# Patient Record
Sex: Female | Born: 1985 | Race: Black or African American | Hispanic: No | Marital: Single | Smoking: Never smoker
Health system: Southern US, Community
[De-identification: ages and names within clinical notes are randomized; demographics above are authoritative.]

## PROBLEM LIST (undated history)

## (undated) DIAGNOSIS — K219 Gastro-esophageal reflux disease without esophagitis: Secondary | ICD-10-CM

## (undated) DIAGNOSIS — F419 Anxiety disorder, unspecified: Secondary | ICD-10-CM

## (undated) DIAGNOSIS — F99 Mental disorder, not otherwise specified: Secondary | ICD-10-CM

## (undated) HISTORY — DX: Mental disorder, not otherwise specified: F99

## (undated) HISTORY — DX: Gastro-esophageal reflux disease without esophagitis: K21.9

## (undated) HISTORY — PX: TONSILLECTOMY: SUR1361

## (undated) HISTORY — PX: WISDOM TOOTH EXTRACTION: SHX21

---
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
## 2007-04-13 ENCOUNTER — Inpatient Hospital Stay (HOSPITAL_COMMUNITY): Admission: AD | Admit: 2007-04-13 | Discharge: 2007-04-13 | Payer: Self-pay | Admitting: Obstetrics and Gynecology

## 2007-04-13 ENCOUNTER — Inpatient Hospital Stay (HOSPITAL_COMMUNITY): Admission: AD | Admit: 2007-04-13 | Discharge: 2007-04-16 | Payer: Self-pay | Admitting: Obstetrics and Gynecology

## 2008-02-15 ENCOUNTER — Emergency Department (HOSPITAL_COMMUNITY): Admission: AC | Admit: 2008-02-15 | Discharge: 2008-02-16 | Payer: Self-pay
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
## 2010-12-28 NOTE — Op Note (Signed)
NAMENATHIFA, RITTHALER               ACCOUNT NO.:  [id number]   MEDICAL RECORD NO.:  [id number]          PATIENT TYPE:  AMB   LOCATION:  DSC                          FACILITY:  [facility]   PHYSICIAN:  Christopher E. 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   OPERATION:  Tonsillectomy and adenoidectomy.   SURGEON:  Kristine Garbe. Ezzard Standing, M.D.   ANESTHESIA:  General endotracheal.   COMPLICATIONS:  None.   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  On examination ,she has  large 2 to 3+ sized tonsils as well as large, obstructing adenoid tissue.  She is taken to the operating room at this time for a tonsillectomy and  adenoidectomy.   DESCRIPTION OF PROCEDURE:  After adequate endotracheal anesthesia, the  patient received 10 mg of Decadron IV preoperatively as well as 1 g of Ancef  IV preoperatively.  A mouth gag was used to expose the oropharynx.  The left  and right tonsils were resected from the tonsillar fossa using the cautery.  Hemostasis was obtained with the cautery.  This completed the tonsillectomy.  A red rubber catheter was passed through the nose and out the mouth to  retract the soft palate. The nasopharynx was examined.  Mary [name] had large  obstructing adenoid tissue back in the nasal choana.  A curved curet was  used to remove a central pad of adenoid tissue.  A nasopharyngeal pack was  placed for hemostasis.  Then, further adenoid tissue was removed with  suction cautery.  After removing adequate adenoid tissue, the nose and  nasopharynx were irrigated with saline.  This completed the procedure.  Mary [name] was  awoken from anesthesia, was transferred to the recovery room, and  postoperatively did well.   DISPOSITION:  Mary [name] is discharged home later this morning on amoxicillin  suspension 400 mg b.i.d. for one week, Tylenol and Lortab elixir 1 to 1-1/2  tablespoons q.4h. p.r.n. pain.  Will have her follow up in my office in 10-  12 days for recheck.      CEN/MEDQ  D:  10/05/2004  T:  10/05/2004  Job:  [id number]

## 2011-05-09 LAB — POCT I-STAT, CHEM 8
BUN: 7
Calcium, Ion: 1.09 — ABNORMAL LOW
Creatinine, Ser: 0.9
Hemoglobin: 13.9
Sodium: 144
TCO2: 20

## 2011-05-09 LAB — CBC
HCT: 40.3
Hemoglobin: 13.5
MCHC: 33.5
MCV: 89.8
Platelets: 243
RBC: 4.48
RDW: 12.4
WBC: 8.1

## 2011-05-09 LAB — SAMPLE TO BLOOD BANK

## 2011-05-09 LAB — PROTIME-INR
INR: 0.9
Prothrombin Time: 12.7

## 2011-05-24 LAB — CBC
HCT: 35.7 — ABNORMAL LOW
Hemoglobin: 12.2
MCHC: 34.5
MCV: 90.2
MCV: 91.5
Platelets: 165
Platelets: 196
RBC: 3.9
RBC: 4.35
WBC: 11.4 — ABNORMAL HIGH
WBC: 13.3 — ABNORMAL HIGH

## 2011-05-24 LAB — COMPREHENSIVE METABOLIC PANEL
ALT: 16
AST: 22
Albumin: 3.3 — ABNORMAL LOW
Calcium: 9
Creatinine, Ser: 0.57
GFR calc Af Amer: >60
Sodium: 136
Total Protein: 6.7

## 2011-05-24 LAB — URIC ACID: Uric Acid, Serum: 4.5

## 2016-06-11 LAB — OB RESULTS CONSOLE RUBELLA ANTIBODY, IGM: Rubella: IMMUNE

## 2016-06-11 LAB — OB RESULTS CONSOLE ABO/RH: RH TYPE: POSITIVE

## 2016-06-11 LAB — OB RESULTS CONSOLE GC/CHLAMYDIA
CHLAMYDIA, DNA PROBE: NEGATIVE
Gonorrhea: NEGATIVE

## 2016-06-11 LAB — OB RESULTS CONSOLE HEPATITIS B SURFACE ANTIGEN: Hepatitis B Surface Ag: NEGATIVE

## 2016-06-11 LAB — OB RESULTS CONSOLE HIV ANTIBODY (ROUTINE TESTING): HIV: NONREACTIVE

## 2016-06-11 LAB — OB RESULTS CONSOLE RPR: RPR: NONREACTIVE

## 2016-06-11 LAB — OB RESULTS CONSOLE ANTIBODY SCREEN: ANTIBODY SCREEN: NEGATIVE

## 2016-08-12 NOTE — L&D Delivery Note (Signed)
31 y.o. [id number] at 4565w0d delivered a viable female infant in cephalic, ROT position. No nuchal cord.  30 second shoulder dystocia resolved with McRoberts maneuver and wood screw. With wood screw, able to deliver the anterior shoulder. 60 sec delayed cord clamping. Cord clamped x2 and cut. Placenta delivered spontaneously intact, with 3VC. Fundus firm on exam with massage and pitocin. Good hemostasis noted.  Anesthesia: Epidural Laceration: Left labial, hemostatic  Suture: n/a  Good hemostasis noted. EBL: 100 cc  Mom and baby recovering in LDR.    Apgars: APGAR (1 MIN):  8 APGAR (5 MINS):  9 APGAR (10 MINS):   Weight: Pending skin to skin  Sponge and instrument count were correct x2. Placenta sent to L&D.   Al CorpusMatthew R Grigor Lipschutz, MD [facility] 01/08/2017, 6:03 PM

## 2016-11-20 ENCOUNTER — Ambulatory Visit (INDEPENDENT_AMBULATORY_CARE_PROVIDER_SITE_OTHER): Payer: Medicaid Other | Admitting: Obstetrics & Gynecology

## 2016-11-20 ENCOUNTER — Encounter: Payer: Self-pay | Admitting: Obstetrics & Gynecology

## 2016-11-20 ENCOUNTER — Encounter: Payer: Self-pay | Admitting: *Deleted

## 2016-11-20 VITALS — BP 105/65 | HR 95 | Ht 65.0 in | Wt 206.5 lb

## 2016-11-20 DIAGNOSIS — O99810 Abnormal glucose complicating pregnancy: Secondary | ICD-10-CM

## 2016-11-20 DIAGNOSIS — Z3483 Encounter for supervision of other normal pregnancy, third trimester: Secondary | ICD-10-CM | POA: Diagnosis not present

## 2016-11-20 DIAGNOSIS — Z3493 Encounter for supervision of normal pregnancy, unspecified, third trimester: Secondary | ICD-10-CM | POA: Insufficient documentation

## 2016-11-20 NOTE — Patient Instructions (Addendum)
Return to clinic for any scheduled appointments or obstetric concerns, or go to [facility] for evaluation   Third Trimester of Pregnancy The third trimester is from week 28 through week 40 (months 7 through 9). The third trimester is a time when the unborn baby (fetus) is growing rapidly. At the end of the ninth month, the fetus is about 20 inches in length and weighs 6-10 pounds. Body changes during your third trimester Your body will continue to go through many changes during pregnancy. The changes vary from woman to woman. During the third trimester:  Your weight will continue to increase. You can expect to gain 25-35 pounds (11-16 kg) by the end of the pregnancy.  You may begin to get stretch marks on your hips, abdomen, and breasts.  You may urinate more often because the fetus is moving lower into your pelvis and pressing on your bladder.  You may develop or continue to have heartburn. This is caused by increased hormones that slow down muscles in the digestive tract.  You may develop or continue to have constipation because increased hormones slow digestion and cause the muscles that push waste through your intestines to relax.  You may develop hemorrhoids. These are swollen veins (varicose veins) in the rectum that can itch or be painful.  You may develop swollen, bulging veins (varicose veins) in your legs.  You may have increased body aches in the pelvis, back, or thighs. This is due to weight gain and increased hormones that are relaxing your joints.  You may have changes in your hair. These can include thickening of your hair, rapid growth, and changes in texture. Some women also have hair loss during or after pregnancy, or hair that feels dry or thin. Your hair will most likely return to normal after your baby is born.  Your breasts will continue to grow and they will continue to become tender. A yellow fluid (colostrum) may leak from your breasts. This is the first milk you are  producing for your baby.  Your belly button may stick out.  You may notice more swelling in your hands, face, or ankles.  You may have increased tingling or numbness in your hands, arms, and legs. The skin on your belly may also feel numb.  You may feel short of breath because of your expanding uterus.  You may have more problems sleeping. This can be caused by the size of your belly, increased need to urinate, and an increase in your body's metabolism.  You may notice the fetus "dropping," or moving lower in your abdomen (lightening).  You may have increased vaginal discharge.  You may notice your joints feel loose and you may have pain around your pelvic bone. What to expect at prenatal visits You will have prenatal exams every 2 weeks until week 36. Then you will have weekly prenatal exams. During a routine prenatal visit:  You will be weighed to make sure you and the baby are growing normally.  Your blood pressure will be taken.  Your abdomen will be measured to track your baby's growth.  The fetal heartbeat will be listened to.  Any test results from the previous visit will be discussed.  You may have a cervical check near your due date to see if your cervix has softened or thinned (effaced).  You will be tested for Group B streptococcus. This happens between 35 and 37 weeks. Your health care provider may ask you:  What your birth plan is.  How  you are feeling.  If you are feeling the baby move.  If you have had any abnormal symptoms, such as leaking fluid, bleeding, severe headaches, or abdominal cramping.  If you are using any tobacco products, including cigarettes, chewing tobacco, and electronic cigarettes.  If you have any questions. Other tests or screenings that may be performed during your third trimester include:  Blood tests that check for low iron levels (anemia).  Fetal testing to check the health, activity level, and growth of the fetus. Testing is  done if you have certain medical conditions or if there are problems during the pregnancy.  Nonstress test (NST). This test checks the health of your baby to make sure there are no signs of problems, such as the baby not getting enough oxygen. During this test, a belt is placed around your belly. The baby is made to move, and its heart rate is monitored during movement. What is false labor? False labor is a condition in which you feel small, irregular tightenings of the muscles in the womb (contractions) that usually go away with rest, changing position, or drinking water. These are called Braxton Hicks contractions. Contractions may last for hours, days, or even weeks before true labor sets in. If contractions come at regular intervals, become more frequent, increase in intensity, or become painful, you should see your health care provider. What are the signs of labor?  Abdominal cramps.  Regular contractions that start at 10 minutes apart and become stronger and more frequent with time.  Contractions that start on the top of the uterus and spread down to the lower abdomen and back.  Increased pelvic pressure and dull back pain.  A watery or bloody mucus discharge that comes from the vagina.  Leaking of amniotic fluid. This is also known as your "water breaking." It could be a slow trickle or a gush. Let your health care provider know if it has a color or strange odor. If you have any of these signs, call your health care provider right away, even if it is before your due date. Follow these instructions at home: Medicines   Follow your health care provider's instructions regarding medicine use. Specific medicines may be either safe or unsafe to take during pregnancy.  Take a prenatal vitamin that contains at least 600 micrograms (mcg) of folic acid.  If you develop constipation, try taking a stool softener if your health care provider approves. Eating and drinking   Eat a balanced diet  that includes fresh fruits and vegetables, whole grains, good sources of protein such as meat, eggs, or tofu, and low-fat dairy. Your health care provider will help you determine the amount of weight gain that is right for you.  Avoid raw meat and uncooked cheese. These carry germs that can cause birth defects in the baby.  If you have low calcium intake from food, talk to your health care provider about whether you should take a daily calcium supplement.  Eat four or five small meals rather than three large meals a day.  Limit foods that are high in fat and processed sugars, such as fried and sweet foods.  To prevent constipation:  Drink enough fluid to keep your urine clear or pale yellow.  Eat foods that are high in fiber, such as fresh fruits and vegetables, whole grains, and beans. Activity   Exercise only as directed by your health care provider. Most women can continue their usual exercise routine during pregnancy. Try to exercise for 30  minutes at least 5 days a week. Stop exercising if you experience uterine contractions.  Avoid heavy lifting.  Do not exercise in extreme heat or humidity, or at high altitudes.  Wear low-heel, comfortable shoes.  Practice good posture.  You may continue to have sex unless your health care provider tells you otherwise. Relieving pain and discomfort   Take frequent breaks and rest with your legs elevated if you have leg cramps or low back pain.  Take warm sitz baths to soothe any pain or discomfort caused by hemorrhoids. Use hemorrhoid cream if your health care provider approves.  Wear a good support bra to prevent discomfort from breast tenderness.  If you develop varicose veins:  Wear support pantyhose or compression stockings as told by your healthcare provider.  Elevate your feet for 15 minutes, 3-4 times a day. Prenatal care   Write down your questions. Take them to your prenatal visits.  Keep all your prenatal visits as told  by your health care provider. This is important. Safety   Wear your seat belt at all times when driving.  Make a list of emergency phone numbers, including numbers for family, friends, the hospital, and police and fire departments. General instructions   Avoid cat litter boxes and soil used by cats. These carry germs that can cause birth defects in the baby. If you have a cat, ask someone to clean the litter box for you.  Do not travel far distances unless it is absolutely necessary and only with the approval of your health care provider.  Do not use hot tubs, steam rooms, or saunas.  Do not drink alcohol.  Do not use any products that contain nicotine or tobacco, such as cigarettes and e-cigarettes. If you need help quitting, ask your health care provider.  Do not use any medicinal herbs or unprescribed drugs. These chemicals affect the formation and growth of the baby.  Do not douche or use tampons or scented sanitary pads.  Do not cross your legs for long periods of time.  To prepare for the arrival of your baby:  Take prenatal classes to understand, practice, and ask questions about labor and delivery.  Make a trial run to the hospital.  Visit the hospital and tour the maternity area.  Arrange for maternity or paternity leave through employers.  Arrange for family and friends to take care of pets while you are in the hospital.  Purchase a rear-facing car seat and make sure you know how to install it in your car.  Pack your hospital bag.  Prepare the baby's nursery. Make sure to remove all pillows and stuffed animals from the baby's crib to prevent suffocation.  Visit your dentist if you have not gone during your pregnancy. Use a soft toothbrush to brush your teeth and be gentle when you floss. Contact a health care provider if:  You are unsure if you are in labor or if your water has broken.  You become dizzy.  You have mild pelvic cramps, pelvic pressure, or  nagging pain in your abdominal area.  You have lower back pain.  You have persistent nausea, vomiting, or diarrhea.  You have an unusual or bad smelling vaginal discharge.  You have pain when you urinate. Get help right away if:  Your water breaks before 37 weeks.  You have regular contractions less than 5 minutes apart before 37 weeks.  You have a fever.  You are leaking fluid from your vagina.  You have spotting or  bleeding from your vagina.  You have severe abdominal pain or cramping.  You have rapid weight loss or weight gain.  You have shortness of breath with chest pain.  You notice sudden or extreme swelling of your face, hands, ankles, feet, or legs.  Your baby makes fewer than 10 movements in 2 hours.  You have severe headaches that do not go away when you take medicine.  You have vision changes. Summary  The third trimester is from week 28 through week 40, months 7 through 9. The third trimester is a time when the unborn baby (fetus) is growing rapidly.  During the third trimester, your discomfort may increase as you and your baby continue to gain weight. You may have abdominal, leg, and back pain, sleeping problems, and an increased need to urinate.  During the third trimester your breasts will keep growing and they will continue to become tender. A yellow fluid (colostrum) may leak from your breasts. This is the first milk you are producing for your baby.  False labor is a condition in which you feel small, irregular tightenings of the muscles in the womb (contractions) that eventually go away. These are called Braxton Hicks contractions. Contractions may last for hours, days, or even weeks before true labor sets in.  Signs of labor can include: abdominal cramps; regular contractions that start at 10 minutes apart and become stronger and more frequent with time; watery or bloody mucus discharge that comes from the vagina; increased pelvic pressure and dull back  pain; and leaking of amniotic fluid. This information is not intended to replace advice given to you by your health care provider. Make sure you discuss any questions you have with your health care provider. Document Released: 07/23/2001 Document Revised: 01/04/2016 Document Reviewed: 09/29/2012 Elsevier Interactive Patient Education  2017 [employer].    Tdap Vaccine (Tetanus, Diphtheria and Pertussis): What You Need to Know 1. Why get vaccinated? Tetanus, diphtheria and pertussis are very serious diseases. Tdap vaccine can protect [place] from these diseases. And, Tdap vaccine given to pregnant women can protect newborn babies against pertussis. TETANUS (Lockjaw) is rare in the [place] today. It causes painful muscle tightening and stiffness, usually all over the body.  It can lead to tightening of muscles in the head and neck so you can't open your mouth, swallow, or sometimes even breathe. Tetanus kills about 1 out of 10 people who are infected even after receiving the best medical care. DIPHTHERIA is also rare in the [place] today. It can cause a thick coating to form in the back of the throat.  It can lead to breathing problems, heart failure, paralysis, and death. PERTUSSIS (Whooping Cough) causes severe coughing spells, which can cause difficulty breathing, vomiting and disturbed sleep.  It can also lead to weight loss, incontinence, and rib fractures. Up to 2 in 100 adolescents and 5 in 100 adults with pertussis are hospitalized or have complications, which could include pneumonia or death. These diseases are caused by bacteria. Diphtheria and pertussis are spread from person to person through secretions from coughing or sneezing. Tetanus enters the body through cuts, scratches, or wounds. Before vaccines, as many as 200,000 cases of diphtheria, 200,000 cases of pertussis, and hundreds of cases of tetanus, were reported in the [place] each year. Since vaccination began,  reports of cases for tetanus and diphtheria have dropped by about 99% and for pertussis by about 80%. 2. Tdap vaccine Tdap vaccine can protect adolescents and adults from  and pertussis. One dose of Tdap is routinely given at age 11 or 12. People who did not get Tdap at that age should get it as soon as possible. Tdap is especially important for healthcare professionals and anyone having close contact with a baby younger than 12 months. Pregnant women should get a dose of Tdap during every pregnancy, to protect the newborn from pertussis. Infants are most at risk for severe, life-threatening complications from pertussis. Another vaccine, called Td, protects against tetanus and diphtheria, but not pertussis. A Td booster should be given every 10 years. Tdap may be given as one of these boosters if you have never gotten Tdap before. Tdap may also be given after a severe cut or burn to prevent tetanus infection. Your doctor or the person giving you the vaccine can give you more information. Tdap may safely be given at the same time as other vaccines. 3. Some people should not get this vaccine  A person who has ever had a life-threatening allergic reaction after a previous dose of any diphtheria, tetanus or pertussis containing vaccine, OR has a severe allergy to any part of this vaccine, should not get Tdap vaccine. Tell the person giving the vaccine about any severe allergies.  Anyone who had coma or long repeated seizures within 7 days after a childhood dose of DTP or DTaP, or a previous dose of Tdap, should not get Tdap, unless a cause other than the vaccine was found. They can still get Td.  Talk to your doctor if you:  have seizures or another nervous system problem,  had severe pain or swelling after any vaccine containing diphtheria, tetanus or pertussis,  ever had a condition called Guillain-Barr Syndrome (GBS),  aren't feeling well on the day the shot is  scheduled. 4. Risks With any medicine, including vaccines, there is a chance of side effects. These are usually mild and go away on their own. Serious reactions are also possible but are rare. Most people who get Tdap vaccine do not have any problems with it. Mild problems following Tdap:  (Did not interfere with activities)  Pain where the shot was given (about 3 in 4 adolescents or 2 in 3 adults)  Redness or swelling where the shot was given (about 1 person in 5)  Mild fever of at least 100.4F (up to about 1 in 25 adolescents or 1 in 100 adults)  Headache (about 3 or 4 people in 10)  Tiredness (about 1 person in 3 or 4)  Nausea, vomiting, diarrhea, stomach ache (up to 1 in 4 adolescents or 1 in 10 adults)  Chills, sore joints (about 1 person in 10)  Body aches (about 1 person in 3 or 4)  Rash, swollen glands (uncommon) Moderate problems following Tdap:  (Interfered with activities, but did not require medical attention)  Pain where the shot was given (up to 1 in 5 or 6)  Redness or swelling where the shot was given (up to about 1 in 16 adolescents or 1 in 12 adults)  Fever over 102F (about 1 in 100 adolescents or 1 in 250 adults)  Headache (about 1 in 7 adolescents or 1 in 10 adults)  Nausea, vomiting, diarrhea, stomach ache (up to 1 or 3 people in 100)  Swelling of the entire arm where the shot was given (up to about 1 in 500). Severe problems following Tdap:  (Unable to perform usual activities; required medical attention)  Swelling, severe pain, bleeding and redness in the arm where   arm where the shot was given (rare). Problems that could happen after any vaccine:   People sometimes faint after a medical procedure, including vaccination. Sitting or lying down for about 15 minutes can help prevent fainting, and injuries caused by a fall. Tell your doctor if you feel dizzy, or have vision changes or ringing in the ears.  Some people get severe pain in the shoulder and have  difficulty moving the arm where a shot was given. This happens very rarely.  Any medication can cause a severe allergic reaction. Such reactions from a vaccine are very rare, estimated at fewer than 1 in a million doses, and would happen within a few minutes to a few hours after the vaccination. As with any medicine, there is a very remote chance of a vaccine causing a serious injury or death. The safety of vaccines is always being monitored. For more information, visit: http://floyd.org/ 5. What if there is a serious problem? What should I look for?  Look for anything that concerns you, such as signs of a severe allergic reaction, very high fever, or unusual behavior. Signs of a severe allergic reaction can include hives, swelling of the face and throat, difficulty breathing, a fast heartbeat, dizziness, and weakness. These would usually start a few minutes to a few hours after the vaccination. What should I do?   If you think it is a severe allergic reaction or other emergency that can't wait, call 9-1-1 or get the person to the nearest hospital. Otherwise, call your doctor.  Afterward, the reaction should be reported to the Vaccine Adverse Event Reporting System (VAERS). Your doctor might file this report, or you can do it yourself through the VAERS web site at www.vaers.LAgents.no, or by calling 1-534-370-0784.  VAERS does not give medical advice. 6. The National Vaccine Injury Compensation Program The [employer] Vaccine Injury Compensation Program (VICP) is a federal program that was created to compensate people who may have been injured by certain vaccines. Persons who believe they may have been injured by a vaccine can learn about the program and about filing a claim by calling 1-402-698-3475 or visiting the VICP website at SpiritualWord.at. There is a time limit to file a claim for compensation. 7. How can I learn more?  Ask your doctor. He or she can give you the  vaccine package insert or suggest other sources of information.  Call your local or state health department.  Contact the Centers for Disease Control and Prevention (CDC):  Call 419-469-9278 (1-800-CDC-INFO) or  Visit CDC's website at PicCapture.uy CDC Tdap Vaccine VIS (10/05/13) This information is not intended to replace advice given to you by your health care provider. Make sure you discuss any questions you have with your health care provider. Document Released: 01/28/2012 Document Revised: 04/18/2016 Document Reviewed: 04/18/2016 Elsevier Interactive Patient Education  2017 [employer].   AREA PEDIATRIC/FAMILY PRACTICE PHYSICIANS  [facility] [address] [place], [place]  [address] Phone - [phone]   Fax - [phone]  [facility] [address] [place], [place] [address] Phone - [phone]   Fax - [phone]  JACK AMOS [address] [place], [place]  [address] Phone - [phone]   Fax - [phone]  [facility] [address] [place], [place]  [address] Phone - [phone]   Fax - [phone]  [facility] [address] [place], [place]  [address] Phone - [phone]   Fax -  [phone]  [facility] [address] [place], [place]  [address] Phone - [phone]   Fax - [phone]  [facility] [place] [address] [place], [place]  [address] Phone - [phone]   Fax - [phone]  [facility] [address], [address] [place], [place]  [address] Phone - [phone]   Fax - [phone]  [facility] AT [facility] [address] [place], [place]  [address] Phone - [phone]   Fax - [phone] [facility] [place] [address] [place], [place]  [address] Phone - [phone]   Fax - [phone]  [facility] [address] [place], [place]  [address] Phone - [phone]   Fax - [phone]  [facility] [address] [place], [place]  [address] Phone - [phone]   Fax - [phone]  [facility] [place] [address] [place], [place]  [address] Phone - [phone]   Fax - [phone]  [facility] [address] [place], [place]  [address] Phone - [phone]  [facility] [address] [place], [place]  [address] Phone - [phone]   Fax - [phone]  [facility] [address] [place], [place]  [address] Phone - [phone]   Fax - [phone]  [facility] [address] [place], [place]  [address] Phone - [phone]   Fax - [phone]  [facility] [address] [place], [place]  [address] Phone - [phone]   Fax - [phone]   [facility] [address] [place], [place]  [address] Phone - [phone]   Fax - [phone]  [facility] [address] [place], [place]  [address] Phone - [phone]   Fax - [phone]  DAVID RUBIN [address] [place], [place]  [address] Phone - [phone]   Fax - [phone]  [facility] [address] [place], [place]  [address] Phone - [phone]   Fax - [phone]  [place] [facility] Alita Chyle [address] [place], [place]  [address] Phone - [phone]   Fax - [phone] Gerarda Fraction [address] [place], [place]  [address] Phone - [phone]   Fax - [phone]  [facility] [address] [place], [place]  [address] Phone - [phone]   Fax - [phone]  [facility] - [place] [address] [place], [place]  [address] Phone - [phone]   Fax - [phone]  [place]  [facility] [place] Wyvonne Lenz MD [address] [place] [address] Phone [phone]  Fax [phone]

## 2016-11-20 NOTE — Progress Notes (Signed)
   PRENATAL VISIT NOTE  Subjective:  Mary [name] is a 31 y.o. G3P2002 at [redacted]w[redacted]d being seen today for transfer of prenatal care from [place], [place].  No issues during pregnancy except for recently elevated 1 hr GTT 175; was not able to do diagnostic test.  She is currently monitored for the following issues for this low-risk pregnancy and has Abnormal glucose tolerance test (GTT) during pregnancy, antepartum and Supervision of normal pregnancy in third trimester on her problem list.  Patient reports no complaints.  Contractions: Not present.  .  Movement: Present. Denies leaking of fluid.   The following portions of the patient's history were reviewed and updated as appropriate: allergies, current medications, past family history, past medical history, past social history, past surgical history and problem list. Problem list updated.  Objective:   Vitals:   11/20/16 1511 11/20/16 1512  BP: 105/65   Pulse: 95   Weight: 206 lb 8 oz (93.7 kg)   Height:   (1.651 m)    Fetal Status: Fetal Heart Rate (bpm): 150 Fundal Height: 33 cm Movement: Present     General:  Alert, oriented and cooperative. Patient is in no acute distress.  Skin: Skin is warm and dry. No rash noted.   Cardiovascular: Normal heart rate noted  Respiratory: Normal respiratory effort, no problems with respiration noted  Abdomen: Soft, gravid, appropriate for gestational age. Pain/Pressure: Present     Pelvic:  Cervical exam deferred        Extremities: Normal range of motion.  Edema: None  Mental Status: Normal mood and affect. Normal behavior. Normal judgment and thought content.   Assessment and Plan:  Pregnancy: G3P2002 at [redacted]w[redacted]d  1. Abnormal glucose tolerance test (GTT) during pregnancy, antepartum Will return for 2 hr GTT tomorrow.  Will follow up results.   2. Encounter for supervision of other normal pregnancy in third trimester Preterm labor symptoms and general obstetric precautions including but not limited  to vaginal bleeding, contractions, leaking of fluid and fetal movement were reviewed in detail with the patient. Please refer to After Visit Summary for other counseling recommendations.  Return in about 1 day (around 11/21/2016) for 2hr GTT.      2 weeks: OB Visit (HOB), TDap.   Tereso Newcomer, MD\

## 2016-11-21 ENCOUNTER — Other Ambulatory Visit: Payer: Medicaid Other

## 2016-11-21 DIAGNOSIS — O99810 Abnormal glucose complicating pregnancy: Secondary | ICD-10-CM

## 2016-11-22 LAB — GLUCOSE TOLERANCE, 2 HOURS W/ 1HR
GLUCOSE, 1 HOUR: 161 mg/dL (ref 65–179)
GLUCOSE, 2 HOUR: 108 mg/dL (ref 65–152)
Glucose, Fasting: 82 mg/dL (ref 65–91)

## 2016-12-02 ENCOUNTER — Ambulatory Visit (INDEPENDENT_AMBULATORY_CARE_PROVIDER_SITE_OTHER): Payer: Medicaid Other | Admitting: Family Medicine

## 2016-12-02 VITALS — BP 113/65 | HR 84 | Wt 207.0 lb

## 2016-12-02 DIAGNOSIS — Z3483 Encounter for supervision of other normal pregnancy, third trimester: Secondary | ICD-10-CM

## 2016-12-02 NOTE — Patient Instructions (Signed)
Gestational Diabetes Mellitus, Diagnosis Gestational diabetes (gestational diabetes mellitus) is a short-term (temporary) form of diabetes that can happen during pregnancy. It goes away after you give birth. It may be caused by one or both of these problems:  Your body does not make enough of a hormone called insulin.  Your body does not respond in a normal way to insulin that it makes. Insulin lets sugars (glucose) go into cells in the body. This gives you energy. If you have diabetes, sugars cannot get into cells. This causes high blood sugar (hyperglycemia). If diabetes is treated, it may not hurt you or your baby. Your doctor will set treatment goals for you. In general, you should have these blood sugar levels:  After not eating for a long time (fasting): 95 mg/dL (5.3 mmol/L).  After meals (postprandial):  One hour after a meal: at or below 140 mg/dL (7.8 mmol/L).  Two hours after a meal: at or below 120 mg/dL (6.7 mmol/L).  A1c (hemoglobin A1c) level: 6-6.5%. Follow these instructions at home: Questions to Ask Your Doctor  You may want to ask these questions:  Do I need to meet with a diabetes educator?  Where can I find a support group for people with diabetes?  What equipment will I need to care for myself at home?  What diabetes medicines do I need? When should I take them?  How often do I need to check my blood sugar?  What number can I call if I have questions?  When is my next doctor's visit? General instructions  Take over-the-counter and prescription medicines only as told by your doctor.  Stay at a healthy weight during pregnancy.  Keep all follow-up visits as told by your doctor. This is important. Contact a doctor if:  Your blood sugar is at or above 240 mg/dL (13.3 mmol/L).  Your blood sugar is at or above 200 mg/dL (11.1 mmol/L) and you have ketones in your pee (urine).  You have been sick or have had a fever for 2 days or more and you are not  getting better.  You have any of these problems for more than 6 hours:  You cannot eat or drink.  You feel sick to your stomach (nauseous).  You throw up (vomit).  You have watery poop (diarrhea). Get help right away if:  Your blood sugar is lower than 54 mg/dL (3 mmol/L).  You get confused.  You have trouble:  Thinking clearly.  Breathing.  Your baby moves less than normal.  You have:  Moderate or large ketone levels in your pee (urine).  Bleeding from your vagina.  Unusual fluid coming from your vagina.  Early contractions. These may feel like tightness in your belly. This information is not intended to replace advice given to you by your health care provider. Make sure you discuss any questions you have with your health care provider. Document Released: 11/20/2015 Document Revised: 01/04/2016 Document Reviewed: 09/01/2015 Elsevier Interactive Patient Education  2017 Elsevier Inc.  

## 2016-12-02 NOTE — Progress Notes (Signed)
      Subjective:  Mary [name] is a 31 y.o. G3P2002 at [redacted]w[redacted]d being seen today for ongoing prenatal care.  She is currently monitored for the following issues for this low-risk pregnancy and has Abnormal glucose tolerance test (GTT) during pregnancy, antepartum and Supervision of normal pregnancy in third trimester on her problem list.  Patient reports no complaints.  Contractions: Not present. Vag. Bleeding: None.  Movement: Present. Denies leaking of fluid.   The following portions of the patient's history were reviewed and updated as appropriate: allergies, current medications, past family history, past medical history, past social history, past surgical history and problem list. Problem list updated.  Objective:   Vitals:   12/02/16 1530  BP: 113/65  Pulse: 84  Weight: 207 lb (93.9 kg)    Fetal Status: Fetal Heart Rate (bpm): 140 Fundal Height: 34 cm Movement: Present      General:  Alert, oriented and cooperative. Patient is in no acute distress.  Skin: Skin is warm and dry. No rash noted.   Cardiovascular: Normal heart rate noted  Respiratory: Normal respiratory effort, no problems with respiration noted  Abdomen: Soft, gravid, appropriate for gestational age. Pain/Pressure: Present     Pelvic:  Cervical exam deferred        Extremities: Normal range of motion.  Edema: None  Mental Status: Normal mood and affect. Normal behavior. Normal judgment and thought content.   Urinalysis:      Assessment and Plan:  Pregnancy: G3P2002 at [redacted]w[redacted]d  1. Encounter for supervision of other normal pregnancy in third trimester - 2 hr GTT was normal - UTD on labs - Return in 2 weeks for routine OB visit   MOC: IUD MOF: Bottle Preterm labor symptoms and general obstetric precautions including but not limited to vaginal bleeding, contractions, leaking of fluid and fetal movement were reviewed in detail with the patient. Please refer to After Visit Summary for other counseling  recommendations.  Return in about 2 weeks (around 12/16/2016) for Routine OB visit.   Jen Mow, DO OB Fellow [facility], [facility]

## 2016-12-16 ENCOUNTER — Other Ambulatory Visit (HOSPITAL_COMMUNITY)
Admission: RE | Admit: 2016-12-16 | Discharge: 2016-12-16 | Disposition: A | Discharge status: Home / Self Care | Payer: Medicaid Other | Source: Ambulatory Visit | Attending: Obstetrics and Gynecology | Admitting: Obstetrics and Gynecology

## 2016-12-16 ENCOUNTER — Ambulatory Visit (INDEPENDENT_AMBULATORY_CARE_PROVIDER_SITE_OTHER): Payer: Medicaid Other | Admitting: Obstetrics and Gynecology

## 2016-12-16 VITALS — BP 121/66 | HR 102 | Wt 210.1 lb

## 2016-12-16 DIAGNOSIS — Z3483 Encounter for supervision of other normal pregnancy, third trimester: Secondary | ICD-10-CM | POA: Diagnosis not present

## 2016-12-16 DIAGNOSIS — Z113 Encounter for screening for infections with a predominantly sexual mode of transmission: Secondary | ICD-10-CM | POA: Diagnosis not present

## 2016-12-16 DIAGNOSIS — Z3493 Encounter for supervision of normal pregnancy, unspecified, third trimester: Secondary | ICD-10-CM | POA: Insufficient documentation

## 2016-12-16 NOTE — Progress Notes (Signed)
Prenatal Visit Note Date: 12/16/2016 Clinic: [facility]  Subjective:  Mary [name] is a 31 y.o. G3P2002 at 7130w5d being seen today for ongoing prenatal care.  She is currently monitored for the following issues for this low-risk pregnancy and has Abnormal glucose tolerance test (GTT) during pregnancy, antepartum and Supervision of normal pregnancy in third trimester on her problem list.  Patient reports no complaints.   Contractions: Irritability. Vag. Bleeding: None.  Movement: Present. Denies leaking of fluid.   The following portions of the patient's history were reviewed and updated as appropriate: allergies, current medications, past family history, past medical history, past social history, past surgical history and problem list. Problem list updated.  Objective:   Vitals:   12/16/16 1522  BP: 121/66  Pulse: (!) 102  Weight: 210 lb 1.6 oz (95.3 kg)    Fetal Status: Fetal Heart Rate (bpm): 153 Fundal Height: 36 cm Movement: Present  Presentation: Vertex  General:  Alert, oriented and cooperative. Patient is in no acute distress.  Skin: Skin is warm and dry. No rash noted.   Cardiovascular: Normal heart rate noted  Respiratory: Normal respiratory effort, no problems with respiration noted  Abdomen: Soft, gravid, appropriate for gestational age. Pain/Pressure: Present     Pelvic:  Cervical exam deferred        Extremities: Normal range of motion.  Edema: None  Mental Status: Normal mood and affect. Normal behavior. Normal judgment and thought content.   Urinalysis:      Assessment and Plan:  Pregnancy: G3P2002 at 7330w5d  1. Encounter for supervision of normal pregnancy in third trimester, unspecified gravidity Routine caree - Strep Gp B NAA - Cervicovaginal ancillary only  Preterm labor symptoms and general obstetric precautions including but not limited to vaginal bleeding, contractions, leaking of fluid and fetal movement were reviewed in detail with  the patient. Please refer to After Visit Summary for other counseling recommendations.  Return in about 10 days (around 12/26/2016) for rob.   [place] Mary [name], Mary Savino, Mary [name]

## 2016-12-16 NOTE — Progress Notes (Signed)
36 week cultures today Does not plan to breastfeed

## 2016-12-17 LAB — CERVICOVAGINAL ANCILLARY ONLY
CHLAMYDIA, DNA PROBE: NEGATIVE
NEISSERIA GONORRHEA: NEGATIVE

## 2016-12-18 LAB — STREP GP B NAA: STREP GROUP B AG: NEGATIVE

## 2016-12-26 ENCOUNTER — Encounter: Payer: Self-pay | Admitting: Family Medicine

## 2016-12-26 ENCOUNTER — Ambulatory Visit (INDEPENDENT_AMBULATORY_CARE_PROVIDER_SITE_OTHER): Payer: Medicaid Other | Admitting: Family Medicine

## 2016-12-26 VITALS — BP 121/70 | HR 89 | Wt 209.9 lb

## 2016-12-26 DIAGNOSIS — Z3483 Encounter for supervision of other normal pregnancy, third trimester: Secondary | ICD-10-CM

## 2016-12-26 NOTE — Progress Notes (Signed)
   PRENATAL VISIT NOTE  Subjective:  Mary [name] is a 31 y.o. G3P2002 at 5854w1d being seen today for ongoing prenatal care.  She is currently monitored for the following issues for this low-risk pregnancy and has Supervision of normal pregnancy in third trimester on her problem list.  Patient reports occasional contractions.  Contractions: Irregular. Vag. Bleeding: None.  Movement: Present. Denies leaking of fluid.   The following portions of the patient's history were reviewed and updated as appropriate: allergies, current medications, past family history, past medical history, past social history, past surgical history and problem list. Problem list updated.  Objective:   Vitals:   12/26/16 1451  BP: 121/70  Pulse: 89  Weight: 209 lb 14.4 oz (95.2 kg)    Fetal Status: Fetal Heart Rate (bpm): 135   Movement: Present     General:  Alert, oriented and cooperative. Patient is in no acute distress.  Skin: Skin is warm and dry. No rash noted.   Cardiovascular: Normal heart rate noted  Respiratory: Normal respiratory effort, no problems with respiration noted  Abdomen: Soft, gravid, appropriate for gestational age. Pain/Pressure: Present     Pelvic:  Cervical exam deferred        Extremities: Normal range of motion.  Edema: None  Mental Status: Normal mood and affect. Normal behavior. Normal judgment and thought content.   Assessment and Plan:  Pregnancy: G3P2002 at 8554w1d  1. Encounter for supervision of other normal pregnancy in third trimester FHT and FH normal  Term labor symptoms and general obstetric precautions including but not limited to vaginal bleeding, contractions, leaking of fluid and fetal movement were reviewed in detail with the patient. Please refer to After Visit Summary for other counseling recommendations.  No Follow-up on file.   Levie HeritageJacob J Stinson, DO

## 2017-01-08 ENCOUNTER — Inpatient Hospital Stay (HOSPITAL_COMMUNITY): Payer: Medicaid Other | Admitting: Anesthesiology

## 2017-01-08 ENCOUNTER — Inpatient Hospital Stay (HOSPITAL_COMMUNITY)
Admission: AD | Admit: 2017-01-08 | Discharge: 2017-01-10 | DRG: 775 | Disposition: A | Discharge status: Home / Self Care | Payer: Medicaid Other | Source: Ambulatory Visit | Attending: Family Medicine | Admitting: Family Medicine

## 2017-01-08 ENCOUNTER — Encounter (HOSPITAL_COMMUNITY): Payer: Self-pay | Admitting: *Deleted

## 2017-01-08 DIAGNOSIS — Z3483 Encounter for supervision of other normal pregnancy, third trimester: Secondary | ICD-10-CM

## 2017-01-08 DIAGNOSIS — Z3493 Encounter for supervision of normal pregnancy, unspecified, third trimester: Secondary | ICD-10-CM | POA: Diagnosis present

## 2017-01-08 DIAGNOSIS — Z3A39 39 weeks gestation of pregnancy: Secondary | ICD-10-CM

## 2017-01-08 DIAGNOSIS — IMO0002 Reserved for concepts with insufficient information to code with codable children: Secondary | ICD-10-CM | POA: Diagnosis present

## 2017-01-08 HISTORY — DX: Anxiety disorder, unspecified: F41.9

## 2017-01-08 LAB — CBC
HEMATOCRIT: 36 % (ref 36.0–46.0)
Hemoglobin: 11.9 g/dL — ABNORMAL LOW (ref 12.0–15.0)
MCH: 28.6 pg (ref 26.0–34.0)
MCHC: 33.1 g/dL (ref 30.0–36.0)
MCV: 86.5 fL (ref 78.0–100.0)
PLATELETS: 198 10*3/uL (ref 150–400)
RBC: 4.16 MIL/uL (ref 3.87–5.11)
RDW: 14 % (ref 11.5–15.5)
WBC: 9.2 10*3/uL (ref 4.0–10.5)

## 2017-01-08 LAB — RPR: RPR: NONREACTIVE

## 2017-01-08 LAB — POCT FERN TEST

## 2017-01-08 LAB — ABO/RH: ABO/RH(D): O POS

## 2017-01-08 LAB — TYPE AND SCREEN
ABO/RH(D): O POS
ANTIBODY SCREEN: NEGATIVE

## 2017-01-08 MED ORDER — ONDANSETRON HCL 4 MG/2ML IJ SOLN
4.0000 mg | Freq: Four times a day (QID) | INTRAMUSCULAR | Status: DC | PRN
  Administered 2017-01-08: 4 mg via INTRAVENOUS
  Filled 2017-01-08: qty 2

## 2017-01-08 MED ORDER — EPHEDRINE 5 MG/ML INJ
10.0000 mg | INTRAVENOUS | Status: DC | PRN
  Filled 2017-01-08: qty 2

## 2017-01-08 MED ORDER — LACTATED RINGERS IV SOLN
INTRAVENOUS | Status: DC
  Administered 2017-01-08: 1000 mL via INTRAVENOUS

## 2017-01-08 MED ORDER — ACETAMINOPHEN 325 MG PO TABS: 650.0000 mg | ORAL_TABLET | ORAL | Status: DC | PRN

## 2017-01-08 MED ORDER — ONDANSETRON HCL 4 MG PO TABS: 4.0000 mg | ORAL_TABLET | ORAL | Status: DC | PRN

## 2017-01-08 MED ORDER — OXYTOCIN BOLUS FROM INFUSION: 500.0000 mL | Freq: Once | INTRAVENOUS | Status: DC

## 2017-01-08 MED ORDER — SIMETHICONE 80 MG PO CHEW: 80.0000 mg | CHEWABLE_TABLET | ORAL | Status: DC | PRN

## 2017-01-08 MED ORDER — PRENATAL MULTIVITAMIN CH
1.0000 | ORAL_TABLET | Freq: Every day | ORAL | Status: DC
  Administered 2017-01-09: 1 via ORAL
  Filled 2017-01-08: qty 1

## 2017-01-08 MED ORDER — IBUPROFEN 600 MG PO TABS
600.0000 mg | ORAL_TABLET | Freq: Four times a day (QID) | ORAL | Status: DC
  Administered 2017-01-09 – 2017-01-10 (×6): 600 mg via ORAL
  Filled 2017-01-08 (×6): qty 1

## 2017-01-08 MED ORDER — BENZOCAINE-MENTHOL 20-0.5 % EX AERO: 1.0000 "application " | INHALATION_SPRAY | CUTANEOUS | Status: DC | PRN

## 2017-01-08 MED ORDER — SOD CITRATE-CITRIC ACID 500-334 MG/5ML PO SOLN: 30.0000 mL | ORAL | Status: DC | PRN

## 2017-01-08 MED ORDER — OXYTOCIN 40 UNITS IN LACTATED RINGERS INFUSION - SIMPLE MED
1.0000 m[IU]/min | INTRAVENOUS | Status: DC
  Administered 2017-01-08: 2 m[IU]/min via INTRAVENOUS
  Administered 2017-01-08: 500 m[IU]/min via INTRAVENOUS
  Administered 2017-01-08: 4 m[IU]/min via INTRAVENOUS
  Filled 2017-01-08: qty 1000

## 2017-01-08 MED ORDER — ONDANSETRON HCL 4 MG/2ML IJ SOLN: 4.0000 mg | INTRAMUSCULAR | Status: DC | PRN

## 2017-01-08 MED ORDER — OXYCODONE-ACETAMINOPHEN 5-325 MG PO TABS: 1.0000 | ORAL_TABLET | ORAL | Status: DC | PRN

## 2017-01-08 MED ORDER — OXYTOCIN 40 UNITS IN LACTATED RINGERS INFUSION - SIMPLE MED: 2.5000 [IU]/h | INTRAVENOUS | Status: DC

## 2017-01-08 MED ORDER — PHENYLEPHRINE 40 MCG/ML (10ML) SYRINGE FOR IV PUSH (FOR BLOOD PRESSURE SUPPORT)
80.0000 ug | PREFILLED_SYRINGE | INTRAVENOUS | Status: DC | PRN
  Filled 2017-01-08: qty 5

## 2017-01-08 MED ORDER — TERBUTALINE SULFATE 1 MG/ML IJ SOLN
0.2500 mg | Freq: Once | INTRAMUSCULAR | Status: DC | PRN
  Filled 2017-01-08: qty 1

## 2017-01-08 MED ORDER — DIPHENHYDRAMINE HCL 50 MG/ML IJ SOLN: 12.5000 mg | INTRAMUSCULAR | Status: DC | PRN

## 2017-01-08 MED ORDER — DIBUCAINE 1 % RE OINT: 1.0000 "application " | TOPICAL_OINTMENT | RECTAL | Status: DC | PRN

## 2017-01-08 MED ORDER — COCONUT OIL OIL: 1.0000 "application " | TOPICAL_OIL | Status: DC | PRN

## 2017-01-08 MED ORDER — LIDOCAINE HCL (PF) 1 % IJ SOLN
30.0000 mL | INTRAMUSCULAR | Status: DC | PRN
  Filled 2017-01-08: qty 30

## 2017-01-08 MED ORDER — WITCH HAZEL-GLYCERIN EX PADS: 1.0000 "application " | MEDICATED_PAD | CUTANEOUS | Status: DC | PRN

## 2017-01-08 MED ORDER — PHENYLEPHRINE 40 MCG/ML (10ML) SYRINGE FOR IV PUSH (FOR BLOOD PRESSURE SUPPORT)
80.0000 ug | PREFILLED_SYRINGE | INTRAVENOUS | Status: DC | PRN
  Filled 2017-01-08: qty 10
  Filled 2017-01-08: qty 5

## 2017-01-08 MED ORDER — FENTANYL 2.5 MCG/ML BUPIVACAINE 1/10 % EPIDURAL INFUSION (WH - ANES)
14.0000 mL/h | INTRAMUSCULAR | Status: DC | PRN
  Administered 2017-01-08 (×3): 14 mL/h via EPIDURAL
  Filled 2017-01-08 (×2): qty 100

## 2017-01-08 MED ORDER — LACTATED RINGERS IV SOLN
500.0000 mL | INTRAVENOUS | Status: DC | PRN
  Administered 2017-01-08: 1000 mL via INTRAVENOUS

## 2017-01-08 MED ORDER — FENTANYL CITRATE (PF) 100 MCG/2ML IJ SOLN
100.0000 ug | INTRAMUSCULAR | Status: DC | PRN
  Administered 2017-01-08: 100 ug via INTRAVENOUS
  Filled 2017-01-08: qty 2

## 2017-01-08 MED ORDER — TETANUS-DIPHTH-ACELL PERTUSSIS 5-2.5-18.5 LF-MCG/0.5 IM SUSP
0.5000 mL | Freq: Once | INTRAMUSCULAR | Status: AC
  Administered 2017-01-09: 0.5 mL via INTRAMUSCULAR
  Filled 2017-01-08: qty 0.5

## 2017-01-08 MED ORDER — SENNOSIDES-DOCUSATE SODIUM 8.6-50 MG PO TABS
2.0000 | ORAL_TABLET | ORAL | Status: DC
  Administered 2017-01-09 (×2): 2 via ORAL
  Filled 2017-01-08 (×2): qty 2

## 2017-01-08 MED ORDER — ZOLPIDEM TARTRATE 5 MG PO TABS
5.0000 mg | ORAL_TABLET | Freq: Every evening | ORAL | Status: DC | PRN
  Administered 2017-01-09: 5 mg via ORAL
  Filled 2017-01-08: qty 1

## 2017-01-08 MED ORDER — LACTATED RINGERS IV SOLN: 500.0000 mL | Freq: Once | INTRAVENOUS | Status: DC

## 2017-01-08 MED ORDER — OXYCODONE-ACETAMINOPHEN 5-325 MG PO TABS: 2.0000 | ORAL_TABLET | ORAL | Status: DC | PRN

## 2017-01-08 MED ORDER — DIPHENHYDRAMINE HCL 25 MG PO CAPS: 25.0000 mg | ORAL_CAPSULE | Freq: Four times a day (QID) | ORAL | Status: DC | PRN

## 2017-01-08 MED ORDER — LIDOCAINE HCL (PF) 1 % IJ SOLN
INTRAMUSCULAR | Status: DC | PRN
  Administered 2017-01-08: 13 mL via EPIDURAL

## 2017-01-08 NOTE — Anesthesia Procedure Notes (Signed)
Epidural Patient location during procedure: OB Start time: 01/08/2017 10:21 AM End time: 01/08/2017 10:35 AM  Staffing Anesthesiologist: Anitra LauthMILLER, WARREN RAY Performed: anesthesiologist   Preanesthetic Checklist Completed: patient identified, site marked, surgical consent, pre-op evaluation, timeout performed, IV checked, risks and benefits discussed and monitors and equipment checked  Epidural Patient position: sitting Prep: Betadine Patient monitoring: heart rate, cardiac monitor, continuous pulse ox and blood pressure Approach: midline Location: L2-L3 Injection technique: LOR saline  Needle:  Needle type: Tuohy  Needle gauge: 17 G Needle length: 9 cm Needle insertion depth: 5 cm Catheter type: closed end flexible Catheter size: 20 Guage Catheter at skin depth: 9 cm Test dose: negative  Assessment Events: blood not aspirated, injection not painful, no injection resistance, negative IV test and no paresthesia  Additional Notes Reason for block:procedure for pain

## 2017-01-08 NOTE — MAU Note (Signed)
PT  SAYS  UC   STRONG  SINCE 0500.  SAYS SROM AT 0300.   NO VE .   TRANSFERRED  CARE   AT END  OF MARCH  FROM [place].    DENIES HSV AND  MRSA.    PNC- IN CLINIC . GBS- NEG

## 2017-01-08 NOTE — H&P (Signed)
Mary [name] is a 31 y.o. female [phone] @ [redacted]w[redacted]d admitted for rupture of membranes and active labor.  She reports a gush of clear fluid that woke her up this morning. After the first gush, very little fluid leaked and she did not require a pad.  Her irregular contractions became regular after the leakage of fluid and continue to become more painful.  Her pregnancy has been uncomplicated.  She transferred care to [facility] at 32 weeks from [place], [place].  She reports good fetal movement, denies vaginal bleeding, vaginal itching/burning, urinary symptoms, h/a, dizziness, n/v, or fever/chills.    OB History    Gravida Para Term Preterm AB Living   4 2 2   1 2    SAB TAB Ectopic Multiple Live Births   1       2     Past Medical History:  Diagnosis Date  . Anxiety   . GERD (gastroesophageal reflux disease)   . Mental disorder    Past Surgical History:  Procedure Laterality Date  . TONSILLECTOMY     Family History: family history includes Hypertension in her father. Social History:  reports that she has never smoked. She has never used smokeless tobacco. She reports that she does not drink alcohol or use drugs.     Maternal Diabetes: No Genetic Screening: Normal Maternal Ultrasounds/Referrals: Normal Fetal Ultrasounds or other Referrals:  None Maternal Substance Abuse:  No Significant Maternal Medications:  None Significant Maternal Lab Results:  Lab values include: Group B Strep negative Other Comments:  None  Review of Systems  Constitutional: Negative for chills, fever and malaise/fatigue.  Eyes: Negative for blurred vision.  Respiratory: Negative for cough and shortness of breath.   Cardiovascular: Negative for chest pain.  Gastrointestinal: Positive for abdominal pain. Negative for heartburn and vomiting.  Genitourinary: Negative for dysuria, frequency and urgency.  Musculoskeletal: Negative.   Neurological: Negative for dizziness and headaches.  Psychiatric/Behavioral: Negative  for depression.   Maternal Medical History:  Reason for admission: Rupture of membranes and contractions.   Contractions: Onset was 1-2 hours ago.   Frequency: regular.   Perceived severity is moderate.    Fetal activity: Perceived fetal activity is normal.   Last perceived fetal movement was within the past hour.    Prenatal complications: no prenatal complications Prenatal Complications - Diabetes: none.    Dilation: 1 Effacement (%): 80 Exam by:: L. Leftwich Kirby CNM Blood pressure 115/72, pulse 88, temperature 97.9 F (36.6 C), temperature source Oral, resp. rate 20, height 5\' 5"  (1.651 m), weight 211 lb 4 oz (95.8 kg), unknown if currently breastfeeding. Maternal Exam:  Uterine Assessment: Contraction strength is moderate.  Contraction frequency is regular.   Abdomen: Fetal presentation: vertex  Cervix: Cervix evaluated by sterile speculum exam and digital exam.     Fetal Exam Fetal Monitor Review: Mode: ultrasound.   Baseline rate: 135.  Variability: moderate (6-25 bpm).   Pattern: accelerations present and no decelerations.    Fetal State Assessment: Category I - tracings are normal.     Physical Exam  Nursing note and vitals reviewed. Constitutional: She is oriented to person, place, and time. She appears well-developed and well-nourished.  Neck: Normal range of motion.  Cardiovascular: Normal rate.   Respiratory: Effort normal.  GI: Soft.  Musculoskeletal: Normal range of motion.  Neurological: She is alert and oriented to person, place, and time.  Skin: Skin is warm and dry.  Psychiatric: She has a normal mood and affect. Her behavior is  normal. Judgment and thought content normal.    Prenatal labs: ABO, Rh:  O positive Antibody:  negative Rubella:  immune RPR:   nonreactive HBsAg:   negative HIV:   negative GBS: Negative (05/07 1519)   Assessment/Plan: [id number] @ 5692w0d by LMP SROM with clear fluid Early labor GBS negative  Admit to  BS Expectant management IV pain medication Epidural when desired Anticipate NSVD  Sharen CounterLisa Leftwich-Kirby 01/08/2017, 7:19 AM

## 2017-01-08 NOTE — Anesthesia Pain Management Evaluation Note (Signed)
  CRNA Pain Management Visit Note  Patient: Mary [name], 31 y.o., female  "Hello [facility]. We have an anesthesia team available at all times to provide care throughout the hospital, including epidural management and anesthesia for C-section. I don't know your plan for the delivery whether it a natural birth, water birth, IV sedation, nitrous supplementation, doula or epidural, but we want to meet your pain goals."   1.Was your pain managed to your expectations on prior hospitalizations?   Yes   2.What is your expectation for pain management during this hospitalization?     Epidural  3.How can we help you reach that goal? Epidural when ready.  Record the patient's initial score and the patient's pain goal.   Pain: 5--patient aware of pain control options.  Pain Goal: 5 [facility] wants you to be able to say your pain was always managed very well.  Jonathyn Carothers L 01/08/2017

## 2017-01-08 NOTE — Progress Notes (Signed)
Labor Progress Note  Mary [name] is a 31 y.o. [id number] at 5927w0d admitted for active labor and rupture of membranes (reported at [facility]).   S: Patient states she is comfortable and continues to breathe through contractions. Her pain is well controlled.   O:  BP 114/73   Pulse 90   Temp 98.6 F (37 C) (Oral)   Resp 16   Ht 5\' 5"  (1.651 m)   Wt 95.8 kg (211 lb 4 oz)   BMI 35.15 kg/m   No intake/output data recorded.  FHT:  FHR: 135 bpm, variability: moderate,  accelerations:  Present,  decelerations:  Absent UC:   regular, every 3 minutes SVE:   Dilation: 9 (puffy anterior lip) Effacement (%): 90 Station: 0 Exam by:: Herr rn SROM/AROM: SROM, clear  Pitocin @ 2 mu/min  Labs: Lab Results  Component Value Date   WBC 9.2 01/08/2017   HGB 11.9 (L) 01/08/2017   HCT 36.0 01/08/2017   MCV 86.5 01/08/2017   PLT 198 01/08/2017    Assessment / Plan: 31 y.o. [id number] 2427w0d in active labor Spontaneous labor, progressing normally  Labor: Progressing on pitocin Fetal Wellbeing:  Category I Pain Control:  Epidural Anticipated MOD:  NSVD  Continue expectant management.   Lavella HammockAlessandra Tomasi, MS3

## 2017-01-08 NOTE — Progress Notes (Signed)
Patient ID: Mary [name], female   DOB: 12/04/1985, 31 y.o.   MRN: [id number]  S: Patient seen & examined for progress of labor. Patient comfortable and breathing through contractions. States her water broke this AM at 3AM.     O:  Vitals:   01/08/17 [id number] 01/08/17 0810 01/08/17 0833 01/08/17 0945  BP: 115/72 118/74  112/70  Pulse: 88 90  85  Resp: 20 20  18   Temp: 97.9 F (36.6 C) 98.2 F (36.8 C) 98.5 F (36.9 C) 98 F (36.7 C)  TempSrc: Oral Oral Oral Oral  Weight: 211 lb 4 oz (95.8 kg)     Height: 5\' 5"  (1.651 m)       Dilation: 4 Effacement (%): 80 Cervical Position: Posterior, Middle Station: -3 Presentation: Vertex Exam by:: Nickie Warwick Forebag was felt on SVE  FHT: 135bpm, mod var, +accels, no decels TOCO: q7-698min   A/P: Will start pitocin Continue expectant management Anticipate SVD

## 2017-01-08 NOTE — Progress Notes (Signed)
4mg zofran IVP

## 2017-01-08 NOTE — Progress Notes (Addendum)
Labor Progress Note  Mary NeedsCheria [name] is a 31 y.o. [id number] at 4779w0d admitted for term labor and rupture of membranes (reported at [facility]3AM).   S: Patient states she is comfortable and continues to breathe through contractions. Her pain is well controlled. She is having intermittent vaginal pressure, but not constant and denies urge to push.   O:  BP 114/73   Pulse 90   Temp 98.6 F (37 C) (Oral)   Resp 16   Ht 5\' 5"  (1.651 m)   Wt 95.8 kg (211 lb 4 oz)   BMI 35.15 kg/m   No intake/output data recorded.  FHT:  FHR: 135 bpm, variability: moderate,  accelerations:  Present,  decelerations:  Absent UC:   regular, every 3 minutes SVE:   Dilation: 9 (puffy anterior lip) Effacement (%): 90 Station: 0 Exam by:: Herr rn SROM/AROM: SROM, clear  Pitocin @ 2 mu/min  Labs: Lab Results  Component Value Date   WBC 9.2 01/08/2017   HGB 11.9 (L) 01/08/2017   HCT 36.0 01/08/2017   MCV 86.5 01/08/2017   PLT 198 01/08/2017    Assessment / Plan: 31 y.o. [id number] 4079w0d in active labor Spontaneous labor, progressing normally with pitocin augmentation for persistent anterior lip   Labor: Active phase, progressing on pitocin given persistent edematous anterior lip . Continue pitocin, titrate per protocol.  Fetal Wellbeing:  Category I Pain Control:  Epidural Anticipated MOD:  NSVD   Mosie EpsteinMatt Aishi Courts, MD

## 2017-01-08 NOTE — Anesthesia Preprocedure Evaluation (Signed)
Anesthesia Evaluation  Patient identified by MRN, date of birth, ID band Patient awake    Reviewed: Allergy & Precautions, NPO status , Patient's Chart, lab work & pertinent test results  Airway Mallampati: II  TM Distance: >3 FB Neck ROM: Full    Dental no notable dental hx.    Pulmonary neg pulmonary ROS,    Pulmonary exam normal breath sounds clear to auscultation       Cardiovascular negative cardio ROS Normal cardiovascular exam Rhythm:Regular Rate:Normal     Neuro/Psych negative neurological ROS  negative psych ROS   GI/Hepatic negative GI ROS, Neg liver ROS, GERD  ,  Endo/Other  negative endocrine ROS  Renal/GU negative Renal ROS  negative genitourinary   Musculoskeletal negative musculoskeletal ROS (+)   Abdominal   Peds negative pediatric ROS (+)  Hematology negative hematology ROS (+)   Anesthesia Other Findings   Reproductive/Obstetrics negative OB ROS (+) Pregnancy                             Anesthesia Physical Anesthesia Plan  ASA: II  Anesthesia Plan: Epidural   Post-op Pain Management:    Induction:   Airway Management Planned:   Additional Equipment:   Intra-op Plan:   Post-operative Plan:   Informed Consent: I have reviewed the patients History and Physical, chart, labs and discussed the procedure including the risks, benefits and alternatives for the proposed anesthesia with the patient or authorized representative who has indicated his/her understanding and acceptance.   Dental advisory given  Plan Discussed with: CRNA  Anesthesia Plan Comments:         Anesthesia Quick Evaluation

## 2017-01-09 ENCOUNTER — Encounter (HOSPITAL_COMMUNITY): Payer: Self-pay | Admitting: *Deleted

## 2017-01-09 MED ORDER — OXYCODONE HCL 5 MG PO TABS
5.0000 mg | ORAL_TABLET | ORAL | Status: DC | PRN
  Administered 2017-01-09 (×3): 5 mg via ORAL
  Filled 2017-01-09 (×3): qty 1

## 2017-01-09 NOTE — Anesthesia Postprocedure Evaluation (Signed)
Anesthesia Post Note  Patient: Mary [name]  Procedure(s) Performed: * No procedures listed *  Patient location during evaluation: Mother Baby Anesthesia Type: Epidural Level of consciousness: awake Pain management: satisfactory to patient Vital Signs Assessment: post-procedure vital signs reviewed and stable Respiratory status: spontaneous breathing Cardiovascular status: stable Anesthetic complications: no        Last Vitals:  Vitals:   01/09/17 0043 01/09/17 0900  BP: (!) 108/55 114/63  Pulse: 88 75  Resp: 18 18  Temp: 36.8 C 36.4 C    Last Pain:  Vitals:   01/09/17 0900  TempSrc: Oral  PainSc: 3    Pain Goal: Patients Stated Pain Goal: 3 (01/09/17 0027)               Cephus ShellingBURGER,Trei Schoch

## 2017-01-09 NOTE — Progress Notes (Signed)
POSTPARTUM PROGRESS NOTE  Post Partum Day 1 Subjective:  Mary [name] is a 31 y.o. [id number] 7225w0d s/p NSVD.  No acute events overnight.  Pt denies problems with ambulating, voiding or po intake.  She denies nausea or vomiting.  Pain is moderately controlled.  She has had flatus. She has not had bowel movement.  Lochia Small.   Objective: Blood pressure 114/63, pulse 75, temperature 97.6 F (36.4 C), temperature source Oral, resp. rate 18, height 5\' 5"  (1.651 m), weight 211 lb 4 oz (95.8 kg), SpO2 99 %, unknown if currently breastfeeding.  Physical Exam:  General: alert, cooperative and no distress Lochia:normal flow Chest: no respiratory distress Heart:regular rate, distal pulses intact Abdomen: soft, nontender,  Uterine Fundus: firm, appropriately tender DVT Evaluation: No calf swelling or tenderness Extremities: trace edema   Recent Labs  01/08/17 0810  HGB 11.9*  HCT 36.0    Assessment/Plan:  ASSESSMENT: Mary [name] is a 31 y.o. [id number] 3925w0d s/p NSVD  Plan for discharge tomorrow   LOS: 1 day   Les Pouicholas SchenkMD 01/09/2017, 11:33 AM

## 2017-01-10 ENCOUNTER — Encounter: Payer: Medicaid Other | Admitting: Family Medicine

## 2017-01-10 MED ORDER — IBUPROFEN 600 MG PO TABS: 600.0000 mg | ORAL_TABLET | Freq: Four times a day (QID) | ORAL | 0 refills | Status: DC

## 2017-01-10 MED ORDER — OXYCODONE HCL 5 MG PO TABS: 5.0000 mg | ORAL_TABLET | ORAL | 0 refills | Status: AC | PRN

## 2017-01-10 NOTE — Progress Notes (Signed)
Discharge education complete, prescription given, discharge instructions and follow up appointment discussed. Patient verbalized understanding. 

## 2017-01-10 NOTE — Discharge Summary (Signed)
Obstetric Discharge Summary Reason for Admission: rupture of membranes Prenatal Procedures: none Intrapartum Procedures: spontaneous vaginal delivery Postpartum Procedures: none Complications-Operative and Postpartum: none Hemoglobin  Date Value Ref Range Status  01/08/2017 11.9 (L) 12.0 - 15.0 g/dL Final   HCT  Date Value Ref Range Status  01/08/2017 36.0 36.0 - 46.0 % Final    Physical Exam:  Lungs clear Heart RRR Abd soft + BS U-2 Ext non tender    Discharge Diagnoses: Term Pregnancy-delivered  Discharge Information: Date: 01/10/2017 Activity: pelvic rest Diet: routine Medications: Ibuprofen and Percocet Condition: stable Instructions: refer to practice specific booklet Discharge to: home Follow-up Information    [facility] Schedule an appointment as soon as possible for a visit in 6 week(s).   Contact information: [address] [place] [phone]          Newborn Data: Live born female  Birth Weight: 8 lb 6 oz (3799 g) APGAR: 8, 9  Home with mother.  Hermina StaggersMichael L Latravious Levitt 01/10/2017, 10:50 AM

## 2017-01-10 NOTE — Progress Notes (Signed)
CSW received consult for hx of Anxiety and Depression.  CSW met with MOB to offer support and complete assessment.    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  MOB communicated that MOB was prescribed medication in the past to assist with employment related stress.   CSW provided education regarding the baby blues period vs. perinatal mood disorders, discussed treatment and gave resources for mental health follow up if concerns arise.  CSW recommends self-evaluation during the postpartum time period using the New Mom Checklist from Postpartum Progress and encouraged MOB to contact a medical professional if symptoms are noted at any time.   CSW provided review of Sudden Infant Death Syndrome (SIDS) precautions.   CSW identifies no further need for intervention and no barriers to discharge at this time.  Mary [name], MSW, LCSW Clinical Social Work [phone]   

## 2017-01-14 ENCOUNTER — Encounter (HOSPITAL_COMMUNITY): Payer: Self-pay | Admitting: *Deleted

## 2017-01-15 ENCOUNTER — Encounter (HOSPITAL_COMMUNITY): Payer: Self-pay

## 2017-01-15 ENCOUNTER — Emergency Department (HOSPITAL_COMMUNITY): Payer: Medicaid Other

## 2017-01-15 ENCOUNTER — Inpatient Hospital Stay (HOSPITAL_COMMUNITY)
Admission: EM | Admit: 2017-01-15 | Discharge: 2017-01-17 | DRG: 189 | Disposition: A | Discharge status: Home / Self Care | Payer: Medicaid Other | Attending: Pulmonary Disease | Admitting: Pulmonary Disease

## 2017-01-15 ENCOUNTER — Encounter (HOSPITAL_COMMUNITY): Payer: Self-pay | Admitting: Emergency Medicine

## 2017-01-15 DIAGNOSIS — K219 Gastro-esophageal reflux disease without esophagitis: Secondary | ICD-10-CM | POA: Diagnosis present

## 2017-01-15 DIAGNOSIS — I5043 Acute on chronic combined systolic (congestive) and diastolic (congestive) heart failure: Secondary | ICD-10-CM | POA: Diagnosis not present

## 2017-01-15 DIAGNOSIS — R0602 Shortness of breath: Secondary | ICD-10-CM

## 2017-01-15 DIAGNOSIS — Z79899 Other long term (current) drug therapy: Secondary | ICD-10-CM | POA: Diagnosis not present

## 2017-01-15 DIAGNOSIS — J9601 Acute respiratory failure with hypoxia: Secondary | ICD-10-CM | POA: Diagnosis present

## 2017-01-15 DIAGNOSIS — I5023 Acute on chronic systolic (congestive) heart failure: Secondary | ICD-10-CM | POA: Diagnosis not present

## 2017-01-15 DIAGNOSIS — J810 Acute pulmonary edema: Secondary | ICD-10-CM | POA: Diagnosis present

## 2017-01-15 DIAGNOSIS — F419 Anxiety disorder, unspecified: Secondary | ICD-10-CM | POA: Diagnosis present

## 2017-01-15 DIAGNOSIS — R042 Hemoptysis: Secondary | ICD-10-CM

## 2017-01-15 DIAGNOSIS — J9600 Acute respiratory failure, unspecified whether with hypoxia or hypercapnia: Secondary | ICD-10-CM | POA: Diagnosis not present

## 2017-01-15 DIAGNOSIS — J811 Chronic pulmonary edema: Secondary | ICD-10-CM

## 2017-01-15 LAB — ECHOCARDIOGRAM COMPLETE
AV Area VTI: 2.24 cm2
AV Peak grad: 13 mmHg
AV peak Index: 1.11
AV pk vel: 182 cm/s
Ao pk vel: 0.71 m/s
E decel time: 289 msec
E/e' ratio: 8.82
FS: 35 % (ref 28–44)
IVS/LV PW RATIO, ED: 1.17
LA ID, A-P, ES: 36 mm
LA diam end sys: 36 mm
LA diam index: 1.78 cm/m2
LA vol A4C: 63.5 ml
LA vol index: 29.9 mL/m2
LA vol: 60.3 mL
LV E/e' medial: 8.82
LV E/e'average: 8.82
LV PW d: 9.51 mm — AB (ref 0.6–1.1)
LV e' LATERAL: 15.3 cm/s
LVOT SV: 95 mL
LVOT VTI: 30.1 cm
LVOT area: 3.14 cm2
LVOT diameter: 20 mm
LVOT peak grad rest: 7 mmHg
LVOT peak vel: 130 cm/s
Lateral S' vel: 6.57 cm/s
MV Dec: 289
MV Peak grad: 7 mmHg
MV pk A vel: 50.7 m/s
MV pk E vel: 135 m/s
PV Reg vel dias: 102 cm/s
TAPSE: 25.7 mm
TDI e' lateral: 15.3
TDI e' medial: 14.4

## 2017-01-15 LAB — BASIC METABOLIC PANEL
Anion gap: 10 (ref 5–15)
BUN: 13 mg/dL (ref 6–20)
CO2: 21 mmol/L — ABNORMAL LOW (ref 22–32)
Calcium: 8.5 mg/dL — ABNORMAL LOW (ref 8.9–10.3)
Chloride: 107 mmol/L (ref 101–111)
Creatinine, Ser: 0.8 mg/dL (ref 0.44–1.00)
GFR calc Af Amer: >60 mL/min (ref >60)
GFR calc non Af Amer: >60 mL/min (ref >60)
Glucose, Bld: 85 mg/dL (ref 65–99)
Potassium: 3.7 mmol/L (ref 3.5–5.1)
Sodium: 138 mmol/L (ref 135–145)

## 2017-01-15 LAB — CBC WITH DIFFERENTIAL/PLATELET
Basophils Absolute: 0.1 10*3/uL (ref 0.0–0.1)
Basophils Relative: 1 %
Eosinophils Absolute: 0.4 10*3/uL (ref 0.0–0.7)
Eosinophils Relative: 5 %
HCT: 35.9 % — ABNORMAL LOW (ref 36.0–46.0)
Hemoglobin: 11.9 g/dL — ABNORMAL LOW (ref 12.0–15.0)
Lymphocytes Relative: 18 %
Lymphs Abs: 1.6 10*3/uL (ref 0.7–4.0)
MCH: 28.4 pg (ref 26.0–34.0)
MCHC: 33.1 g/dL (ref 30.0–36.0)
MCV: 85.7 fL (ref 78.0–100.0)
Monocytes Absolute: 0.7 10*3/uL (ref 0.1–1.0)
Monocytes Relative: 8 %
Neutro Abs: 6 10*3/uL (ref 1.7–7.7)
Neutrophils Relative %: 68 %
Platelets: 230 10*3/uL (ref 150–400)
RBC: 4.19 MIL/uL (ref 3.87–5.11)
RDW: 13.7 % (ref 11.5–15.5)
WBC: 8.8 10*3/uL (ref 4.0–10.5)

## 2017-01-15 LAB — RAPID URINE DRUG SCREEN, HOSP PERFORMED
Amphetamines: NOT DETECTED
BARBITURATES: NOT DETECTED
Benzodiazepines: NOT DETECTED
COCAINE: NOT DETECTED
OPIATES: NOT DETECTED
Tetrahydrocannabinol: NOT DETECTED

## 2017-01-15 LAB — BRAIN NATRIURETIC PEPTIDE: B Natriuretic Peptide: 288.5 pg/mL — ABNORMAL HIGH (ref 0.0–100.0)

## 2017-01-15 LAB — MRSA PCR SCREENING: MRSA by PCR: NEGATIVE

## 2017-01-15 LAB — I-STAT TROPONIN, ED: Troponin i, poc: 0.01 ng/mL (ref 0.00–0.08)

## 2017-01-15 MED ORDER — IOPAMIDOL (ISOVUE-370) INJECTION 76%
INTRAVENOUS | Status: AC
  Administered 2017-01-15: 100 mL via INTRAVENOUS
  Filled 2017-01-15: qty 100

## 2017-01-15 MED ORDER — IBUPROFEN 800 MG PO TABS
800.0000 mg | ORAL_TABLET | Freq: Once | ORAL | Status: AC
  Administered 2017-01-15: 800 mg via ORAL
  Filled 2017-01-15: qty 1

## 2017-01-15 MED ORDER — LORAZEPAM 2 MG/ML IJ SOLN
0.5000 mg | Freq: Once | INTRAMUSCULAR | Status: DC
  Filled 2017-01-15: qty 1

## 2017-01-15 MED ORDER — ORAL CARE MOUTH RINSE: 15.0000 mL | Freq: Two times a day (BID) | OROMUCOSAL | Status: DC

## 2017-01-15 MED ORDER — PANTOPRAZOLE SODIUM 40 MG IV SOLR
40.0000 mg | INTRAVENOUS | Status: DC
  Administered 2017-01-15: 40 mg via INTRAVENOUS
  Filled 2017-01-15: qty 40

## 2017-01-15 MED ORDER — FUROSEMIDE 10 MG/ML IJ SOLN
40.0000 mg | INTRAMUSCULAR | Status: AC
  Administered 2017-01-15: 40 mg via INTRAVENOUS
  Filled 2017-01-15: qty 4

## 2017-01-15 MED ORDER — ZOLPIDEM TARTRATE 5 MG PO TABS
5.0000 mg | ORAL_TABLET | Freq: Once | ORAL | Status: AC
  Administered 2017-01-15: 5 mg via ORAL
  Filled 2017-01-15: qty 1

## 2017-01-15 MED ORDER — IBUPROFEN 600 MG PO TABS
600.0000 mg | ORAL_TABLET | Freq: Four times a day (QID) | ORAL | Status: DC | PRN
  Administered 2017-01-15 – 2017-01-17 (×4): 600 mg via ORAL
  Filled 2017-01-15: qty 3
  Filled 2017-01-15: qty 1
  Filled 2017-01-15: qty 3
  Filled 2017-01-15: qty 1

## 2017-01-15 MED ORDER — ENOXAPARIN SODIUM 40 MG/0.4ML ~~LOC~~ SOLN
40.0000 mg | SUBCUTANEOUS | Status: DC
  Administered 2017-01-15: 40 mg via SUBCUTANEOUS
  Filled 2017-01-15 (×2): qty 0.4

## 2017-01-15 MED ORDER — SODIUM CHLORIDE 0.9 % IV SOLN: INTRAVENOUS | Status: DC

## 2017-01-15 MED ORDER — ONDANSETRON HCL 4 MG/2ML IJ SOLN: 4.0000 mg | Freq: Four times a day (QID) | INTRAMUSCULAR | Status: DC | PRN

## 2017-01-15 NOTE — ED Notes (Signed)
ED Provider at bedside. 

## 2017-01-15 NOTE — Progress Notes (Signed)
Placed patient on BiPAP per MD's request  

## 2017-01-15 NOTE — Consult Note (Signed)
Cardiology Consultation:   Patient ID: [name]; [id number]; 1986/03/19   Admit date: 01/15/2017 Date of Consult: 01/15/2017  Primary Care Provider: Patient, No Pcp Per Primary Cardiologist: New/[facility] Primary Electrophysiologist:  [facility]   Patient Profile:   Mary [name] is a 31 y.o. female with a hx of dyspnea who is being seen today for the evaluation of CHF at the request of Sood.  History of Present Illness:   Mary [name] 31 y.o. with no previous cardiac history. Has a healthy 9 and 30 yo at home and just delivered a healthy baby girl vaginally on 5/30/  Pregnancy not complicated by bleeding, sepsis eclampsia. No protein in urine no edema. Denies clinical aspiration. 2 days ago had dyspnea. With cough and clear sputum. ? Bloody streaks. No history of asthma, COPD, stopped smoking about 3 years ago. Denies drugs, ETOH vaping. Both younger children have a cough/cold but no fevers. On admission CTA no PE or amniotic fluid embolus bilateral interstitial changes with differential including edema, bleeding or infection. BNP only 288 WBC normal Cr normal tox screen negative for drugs. Received lasix in ER and feels better now  No chest pain But does describe chronic history of occasional difficulty taking a deep breath  No connective tissue disease, rash , arthragia or lupus   Past Medical History:  Diagnosis Date  . Anxiety   . GERD (gastroesophageal reflux disease)   . Mental disorder     Past Surgical History:  Procedure Laterality Date  . TONSILLECTOMY    . WISDOM TOOTH EXTRACTION       Inpatient Medications: Scheduled Meds: . enoxaparin (LOVENOX) injection  40 mg Subcutaneous Q24H  . LORazepam  0.5 mg Intravenous Once  . pantoprazole (PROTONIX) IV  40 mg Intravenous Q24H   Continuous Infusions: . sodium chloride     PRN Meds: ondansetron  Allergies:   No Known Allergies  Social History:   Social History   Social History  . Marital status: Single    Spouse  name: N/A  . Number of children: N/A  . Years of education: N/A   Occupational History  . Not on file.   Social History Main Topics  . Smoking status: Never Smoker  . Smokeless tobacco: Never Used  . Alcohol use No  . Drug use: No  . Sexual activity: Yes   Other Topics Concern  . Not on file   Social History Narrative  . No narrative on file    Family History:   The patient's family history includes Hypertension in her father.  ROS:  Please see the history of present illness.  ROS  All other ROS reviewed and negative.     Physical Exam/Data:   Vitals:   01/15/17 1330 01/15/17 1345 01/15/17 1400 01/15/17 1415  BP: (!) 143/81 119/81 (!) 111/58 108/64  Pulse: (!) 53 (!) 52 (!) 56 (!) 54  Resp: (!) 21 (!) 22 18 (!) 24  Temp:      TempSrc:      SpO2: 94% 99% 96% 95%  Weight:      Height:        Intake/Output Summary (Last 24 hours) at 01/15/17 1440 Last data filed at 01/15/17 1144  Gross per 24 hour  Intake                0 ml  Output              800 ml  Net             -  800 ml   Filed Weights   01/15/17 1300  Weight: 197 lb 8.5 oz (89.6 kg)   Body mass index is 32.87 kg/m.  General:  Well nourished, well developed, in no acute distress  HEENT: normal Lymph: no adenopathy Neck: no JVD Endocrine:  No thryomegaly Vascular: No carotid bruits; FA pulses 2+ bilaterally without bruits  Cardiac:  normal S1, S2; RRR; no murmur   Lungs:  Basilar crackles   Abd: soft, nontender, no hepatomegaly  Ext: no edema Musculoskeletal:  No deformities, BUE and BLE strength normal and equal Skin: warm and dry  Neuro:  CNs 2-12 intact, no focal abnormalities noted Psych:  Normal affect    EKG:  NSR no LVH normal   Relevant CV Studies: Echo :  EF 55-60% mild LVH mild LAE noraml diastolic parameters  Laboratory Data:  Chemistry Recent Labs Lab 01/15/17 0638  NA 138  K 3.7  CL 107  CO2 21*  GLUCOSE 85  BUN 13  CREATININE 0.80  CALCIUM 8.5*  GFRNONAA >60    GFRAA >60  ANIONGAP 10    No results for input(s): PROT, ALBUMIN, AST, ALT, ALKPHOS, BILITOT in the last 168 hours. Hematology Recent Labs Lab 01/15/17 0638  WBC 8.8  RBC 4.19  HGB 11.9*  HCT 35.9*  MCV 85.7  MCH 28.4  MCHC 33.1  RDW 13.7  PLT 230   Cardiac EnzymesNo results for input(s): TROPONINI in the last 168 hours.  Recent Labs Lab 01/15/17 0642  TROPIPOC 0.01    BNP Recent Labs Lab 01/15/17 0638  BNP 288.5*    DDimer No results for input(s): DDIMER in the last 168 hours.  Radiology/Studies:  Ct Angio Chest Pe W Or Wo Contrast  Result Date: 01/15/2017 CLINICAL DATA:  Shortness of breath and hemoptysis EXAM: CT ANGIOGRAPHY CHEST WITH CONTRAST TECHNIQUE: Multidetector CT imaging of the chest was performed using the standard protocol during bolus administration of intravenous contrast. Multiplanar CT image reconstructions and MIPs were obtained to evaluate the vascular anatomy. CONTRAST:  100 cc Isovue 370 intravenous COMPARISON:  None. FINDINGS: Cardiovascular: Satisfactory opacification of the pulmonary arteries to the segmental level. No evidence of pulmonary embolism. Borderline heart size. No pericardial effusion. Mediastinum/Nodes: Negative for adenopathy. Prominent thymus without discrete mass, 22 mm in AP dimension. Lungs/Pleura: Symmetric bilateral airspace disease with septal thickening and small layering effusions. Negative for air leak. Upper Abdomen: Negative Musculoskeletal: Negative Review of the MIP images confirms the above findings. IMPRESSION: 1. Extensive symmetric airspace disease with septal thickening and pleural effusions, favor cardiogenic edema. 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 Diffuse alveolar hemorrhage is a consideration given the history of hemoptysis. Multi lobar pneumonia could have this appearance in the appropriate clinical setting. 2. Negative for pulmonary embolism. 3. Hyperplastic appearance of the thymus. Electronically Signed   By:  Marnee SpringJonathon  Watts M.D.   On: 01/15/2017 08:36    Assessment and Plan:   1. CHF:  Not clear what symmetrical infiltrate on CT represents. BNP mildly elevated and improved with lasix However echo does not show postpartum DCM.  Clinically history weak For aspiration. Possible atypical infection with children having cough. No evidence of eclampsia Cultures pending does not appear toxic. Continue diuresis for now. 2. Post Partum:  Not breast feeding mother here to help vaginal area healed well with no issues  3. GERD:  Continue zantac/protonix aspiration precautions    Signed, Charlton HawsPeter Travell Desaulniers, MD  01/15/2017 2:40 PM

## 2017-01-15 NOTE — ED Notes (Signed)
O2 increased to 5L/M/[place] due to O2 sats at 88% on 2L/M/[place]

## 2017-01-15 NOTE — ED Notes (Signed)
Attempted to call for report x2

## 2017-01-15 NOTE — ED Triage Notes (Signed)
Pt reports she had a baby on may 30th, began having sob 2 days ago, denies any chest pain. Reports hemoptysis this am. Pt a/ox4, resp labored at rest. o2 sat 92% on RA

## 2017-01-15 NOTE — H&P (Signed)
PCCM History and Physical Note  Admission date: 01/15/2017 Referring provider: Glenford Bayley, ER  CC: Short of breath  HPI: 31 yo female with 3rd pregnancy had normal vaginal delivery on 01/08/17.  She was d/c home 01/10/17.  Did not have any trouble with blood sugar, blood pressure, protein in urine, or swelling during pregnancy.  She did report having reflux during pregnancy and had episode of vomiting during delivery, but didn't feel like she swallowed anything down the wrong pipe.  On 01/13/17 she started feeling short of breath and chest discomfort.  This has become progressively worse.  She started getting cough with clear sputum, but then started having blood streaks in sputum.  She denies tobacco or illicit drug use.  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  PLT and LFTs okay.  She was started on bipap and given lasix.  Cardiology consult pending.  PMHx - GERD, anxiety  PSHx - tonsillectomy  FHx - Father has HTN  SHx - denies smoking   No Known Allergies   No current facility-administered medications on file prior to encounter.    Current Outpatient Prescriptions on File Prior to Encounter  Medication Sig  . ibuprofen (ADVIL,MOTRIN) 600 MG tablet Take 1 tablet (600 mg total) by mouth every 6 (six) hours.  Marland Kitchen oxyCODONE (OXY IR/ROXICODONE) 5 MG immediate release tablet Take 1 tablet (5 mg total) by mouth every 4 (four) hours as needed for moderate pain.  . Prenatal Vit-Fe Fumarate-FA (MULTIVITAMIN-PRENATAL) 27-0.8 MG TABS tablet Take 1 tablet by mouth daily at 12 noon.  . ranitidine (ZANTAC) 150 MG tablet Take 150 mg by mouth 2 (two) times daily as needed for heartburn.     ROS: 12 point ROS negative except above  Vital signs: BP 138/83   Pulse (!) 57   Temp 97.8 F (36.6 C) (Oral)   Resp (!) 24   SpO2 91%   Intake/output: No intake/output data recorded.  General: sitting up in bed Neuro: alert, normal strength, CN intact HEENT: Pupils reactive,  Bipap mask on Cardiac: regular, no murmur Chest: b/l crackles Abd: soft, non tender Ext: 1+ ankle edema Skin: no rashes   CMP Latest Ref Rng & Units 01/15/2017 02/15/2008 04/13/2007  Glucose 65 - 99 mg/dL 85 96 70  BUN 6 - 20 mg/dL 13 7 3(L)  Creatinine 4.09 - 1.00 mg/dL 8.11 0.9 9.14  Sodium 782 - 145 mmol/L 138 144 136  Potassium 3.5 - 5.1 mmol/L 3.7 3.4(L) 3.7  Chloride 101 - 111 mmol/L 107 110 107  CO2 22 - 32 mmol/L 21(L) - 22  Calcium 8.9 - 10.3 mg/dL 9.5(A) - 9.0  Total Protein - - - 6.7  Total Bilirubin - - - 0.4  Alkaline Phos - - - 133(H)  AST - - - 22  ALT - - - 16     CBC Latest Ref Rng & Units 01/15/2017 01/08/2017 02/15/2008  WBC 4.0 - 10.5 K/uL 8.8 9.2 -  Hemoglobin 12.0 - 15.0 g/dL 11.9(L) 11.9(L) 13.9  Hematocrit 36.0 - 46.0 % 35.9(L) 36.0 41.0  Platelets 150 - 400 K/uL 230 198 -     ABG    Component Value Date/Time   TCO2 20 02/15/2008 2210     CBG (last 3)  No results for input(s): GLUCAP in the last 72 hours.   Imaging: Ct Angio Chest Pe W Or Wo Contrast  Result Date: 01/15/2017 CLINICAL DATA:  Shortness of breath and hemoptysis EXAM: CT ANGIOGRAPHY CHEST WITH CONTRAST  TECHNIQUE: Multidetector CT imaging of the chest was performed using the standard protocol during bolus administration of intravenous contrast. Multiplanar CT image reconstructions and MIPs were obtained to evaluate the vascular anatomy. CONTRAST:  100 cc Isovue 370 intravenous COMPARISON:  None. FINDINGS: Cardiovascular: Satisfactory opacification of the pulmonary arteries to the segmental level. No evidence of pulmonary embolism. Borderline heart size. No pericardial effusion. Mediastinum/Nodes: Negative for adenopathy. Prominent thymus without discrete mass, 22 mm in AP dimension. Lungs/Pleura: Symmetric bilateral airspace disease with septal thickening and small layering effusions. Negative for air leak. Upper Abdomen: Negative Musculoskeletal: Negative Review of the MIP images confirms the  above findings. IMPRESSION: 1. Extensive symmetric airspace disease with septal thickening and pleural effusions, favor cardiogenic edema. 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 Diffuse alveolar hemorrhage is a consideration given the history of hemoptysis. Multi lobar pneumonia could have this appearance in the appropriate clinical setting. 2. Negative for pulmonary embolism. 3. Hyperplastic appearance of the thymus. Electronically Signed   By: Marnee SpringJonathon  Watts M.D.   On: 01/15/2017 08:36     Studies: CT angio chest 6/06 >> b/l ASD with septal thickening and pleural effusions  Events: 6/06 Admit, cardiology consulted  Summary: 31 yo with acute hypoxic respiratory failure with pulmonary infiltrates most likely related to acute pulmonary edema.  Less concern for aspiration pneumonitis.  Assessment/plan:  Acute hypoxic respiratory failure. - oxygen to keep SpO2 > 92% - continue Bipap - f/u CXR  Acute pulmonary edema with concern for post partum CM. - lasix - cardiology consult pending - f/u Echo - check UDS  Hx of GERD. - protonix  DVT prophylaxis - lovenox SUP - protonix Nutrition - NPO Goals of care - full code  Updated pts family at bedside  CC time 33 minutes  Coralyn HellingVineet Breelle Hollywood, MD [facility] 01/15/2017, 10:55 AM Pager:  [phone] After 3pm call: [phone]

## 2017-01-15 NOTE — Progress Notes (Signed)
eLink Physician-Brief Progress Note Patient Name: Mary [name] DOB: 11/07/1985 MRN: [id number]   Date of Service  01/15/2017   HPI/Events of Note  Patient requests home Ibuprofen for pain and a sleep aid.  Creatinine = 0.80.  eICU Interventions  Will order: 1. Motrin 600 mg PO Q 6 hours PRN pain.  2. Ambien 5 mg at 10 PM X 1.     Intervention Category Intermediate Interventions: Pain - evaluation and management  Mary [name] 01/15/2017, 9:39 PM

## 2017-01-15 NOTE — Progress Notes (Signed)
  Echocardiogram 2D Echocardiogram has been performed.  Mary [name] T Mary [name] 01/15/2017, 11:52 AM

## 2017-01-15 NOTE — ED Notes (Signed)
Attempting to call report and no is answering on unit.

## 2017-01-15 NOTE — ED Provider Notes (Signed)
[facility]-EMERGENCY DEPT Provider Note   CSN: [id number] Arrival date & time: 01/15/17  3086     History   Chief Complaint Chief Complaint  Patient presents with  . Shortness of Breath  . Hemoptysis    HPI Mary [name] is a 31 y.o. female with recent history term delivery 1 week ago who presents with shortness of breath and hemoptysis. Patient reports her shortness of breath began 2 days ago. It is worse with exertion. It has been constant since onset. She began with blood-tinged sputum today. She reports a chest tightness and dull ache. It is worse with inspiration. It does not radiate. She denies any leg pain or swelling. Her newborn is healthy and has had no problems. Patient did not have any extended hospital stay. Patient has no history of blood clot. She reports persistent abdominal pain since the birth, however she feels this is expected. She denies any nausea, vomiting, or urinary symptoms. Patient was feeling well up until the shortness of breath started 2 days ago. She denies any previous URI symptoms or cough.  HPI  Past Medical History:  Diagnosis Date  . Anxiety   . GERD (gastroesophageal reflux disease)   . Mental disorder     Patient Active Problem List   Diagnosis Date Noted  . Acute respiratory failure (HCC) 01/15/2017  . Rupture of membranes with clear amniotic fluid 01/08/2017  . Supervision of normal pregnancy in third trimester 11/20/2016    Past Surgical History:  Procedure Laterality Date  . TONSILLECTOMY    . WISDOM TOOTH EXTRACTION      OB History    Gravida Para Term Preterm AB Living   5 3 3   1 3    SAB TAB Ectopic Multiple Live Births   1     0 3       Home Medications    Prior to Admission medications   Medication Sig Start Date End Date Taking? Authorizing Provider  ibuprofen (ADVIL,MOTRIN) 600 MG tablet Take 1 tablet (600 mg total) by mouth every 6 (six) hours. 01/10/17  Yes Hermina Staggers, MD  oxyCODONE (OXY IR/ROXICODONE) 5 MG  immediate release tablet Take 1 tablet (5 mg total) by mouth every 4 (four) hours as needed for moderate pain. 01/10/17  Yes Hermina Staggers, MD  Prenatal Vit-Fe Fumarate-FA (MULTIVITAMIN-PRENATAL) 27-0.8 MG TABS tablet Take 1 tablet by mouth daily at 12 noon.   Yes [provider]  ranitidine (ZANTAC) 150 MG tablet Take 150 mg by mouth 2 (two) times daily as needed for heartburn.    Yes [provider]    Family History Family History  Problem Relation Age of Onset  . Hypertension Father     Social History Social History  Substance Use Topics  . Smoking status: Never Smoker  . Smokeless tobacco: Never Used  . Alcohol use No     Allergies   Patient has no known allergies.   Review of Systems Review of Systems  Constitutional: Negative for chills and fever.  HENT: Negative for congestion, facial swelling, rhinorrhea and sore throat.   Respiratory: Positive for cough and shortness of breath.   Cardiovascular: Positive for chest pain. Negative for leg swelling.  Gastrointestinal: Negative for abdominal pain, nausea and vomiting.  Genitourinary: Negative for dysuria.  Musculoskeletal: Negative for back pain.  Skin: Negative for rash and wound.  Neurological: Negative for headaches.  Psychiatric/Behavioral: The patient is not nervous/anxious.      Physical Exam Updated Vital Signs BP  126/86   Pulse (!) 58   Temp 97.8 F (36.6 C) (Oral)   Resp (!) 27   Ht 5\' 5"  (1.651 m)   Wt 89.6 kg (197 lb 8.5 oz)   SpO2 100%   BMI 32.87 kg/m   Physical Exam  Constitutional: She appears well-developed and well-nourished. No distress.  HENT:  Head: Normocephalic and atraumatic.  Mouth/Throat: Oropharynx is clear and moist. No oropharyngeal exudate.  Eyes: Conjunctivae are normal. Pupils are equal, round, and reactive to light. Right eye exhibits no discharge. Left eye exhibits no discharge. No scleral icterus.  Neck: Normal range of motion. Neck supple. No  thyromegaly present.  Cardiovascular: Normal rate, regular rhythm, normal heart sounds and intact distal pulses.  Exam reveals no gallop and no friction rub.   No murmur heard. Pulmonary/Chest: Effort normal and breath sounds normal. No stridor. No respiratory distress. She has no wheezes. She has no rales. She exhibits no tenderness.  Increased work of breathing. At rest, patient saturating at 96% on room air, however nursing staff reported the saturations to upper 80s on walking.  Abdominal: Soft. Bowel sounds are normal. She exhibits no distension. There is no tenderness. There is no rebound and no guarding.  Musculoskeletal: She exhibits no edema.  Lymphadenopathy:    She has no cervical adenopathy.  Neurological: She is alert. Coordination normal.  Skin: Skin is warm and dry. No rash noted. She is not diaphoretic. No pallor.  Psychiatric: She has a normal mood and affect.  Nursing note and vitals reviewed.    ED Treatments / Results  Labs (all labs ordered are listed, but only abnormal results are displayed) Labs Reviewed  BASIC METABOLIC PANEL - Abnormal; Notable for the following:       Result Value   CO2 21 (*)    Calcium 8.5 (*)    All other components within normal limits  CBC WITH DIFFERENTIAL/PLATELET - Abnormal; Notable for the following:    Hemoglobin 11.9 (*)    HCT 35.9 (*)    All other components within normal limits  BRAIN NATRIURETIC PEPTIDE - Abnormal; Notable for the following:    B Natriuretic Peptide 288.5 (*)    All other components within normal limits  MRSA PCR SCREENING  RAPID URINE DRUG SCREEN, HOSP PERFORMED  I-STAT TROPOININ, ED    EKG  EKG Interpretation  Date/Time:  Wednesday January 15 2017 06:25:44 EDT Ventricular Rate:  66 PR Interval:    QRS Duration: 94 QT Interval:  388 QTC Calculation: 407 R Axis:   51 Text Interpretation:  Sinus rhythm RSR' in V1 or V2, probably normal variant Confirmed by Ross Marcus ([id number]) on 01/15/2017 6:47:14  AM       Radiology Ct Angio Chest Pe W Or Wo Contrast  Result Date: 01/15/2017 CLINICAL DATA:  Shortness of breath and hemoptysis EXAM: CT ANGIOGRAPHY CHEST WITH CONTRAST TECHNIQUE: Multidetector CT imaging of the chest was performed using the standard protocol during bolus administration of intravenous contrast. Multiplanar CT image reconstructions and MIPs were obtained to evaluate the vascular anatomy. CONTRAST:  100 cc Isovue 370 intravenous COMPARISON:  None. FINDINGS: Cardiovascular: Satisfactory opacification of the pulmonary arteries to the segmental level. No evidence of pulmonary embolism. Borderline heart size. No pericardial effusion. Mediastinum/Nodes: Negative for adenopathy. Prominent thymus without discrete mass, 22 mm in AP dimension. Lungs/Pleura: Symmetric bilateral airspace disease with septal thickening and small layering effusions. Negative for air leak. Upper Abdomen: Negative Musculoskeletal: Negative Review of the MIP images confirms the  above findings. IMPRESSION: 1. Extensive symmetric airspace disease with septal thickening and pleural effusions, favor cardiogenic edema. 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 Diffuse alveolar hemorrhage is a consideration given the history of hemoptysis. Multi lobar pneumonia could have this appearance in the appropriate clinical setting. 2. Negative for pulmonary embolism. 3. Hyperplastic appearance of the thymus. Electronically Signed   By: Marnee SpringJonathon  Watts M.D.   On: 01/15/2017 08:36    Procedures Procedures (including critical care time)  CRITICAL CARE Performed by: Emi HolesAlexandra M Elliet Goodnow   Total critical care time: 30 minutes  Critical care time was exclusive of separately billable procedures and treating other patients.  Critical care was necessary to treat or prevent imminent or life-threatening deterioration.  Critical care was time spent personally by me on the following activities: development of treatment plan with patient and/or  surrogate as well as nursing, discussions with consultants, evaluation of patient's response to treatment, examination of patient, obtaining history from patient or surrogate, ordering and performing treatments and interventions, ordering and review of laboratory studies, ordering and review of radiographic studies, pulse oximetry and re-evaluation of patient's condition.   Medications Ordered in ED Medications  LORazepam (ATIVAN) injection 0.5 mg (0.5 mg Intravenous Not Given 01/15/17 1214)  0.9 %  sodium chloride infusion (not administered)  pantoprazole (PROTONIX) injection 40 mg (40 mg Intravenous Given 01/15/17 1244)  ondansetron (ZOFRAN) injection 4 mg (not administered)  enoxaparin (LOVENOX) injection 40 mg (not administered)  iopamidol (ISOVUE-370) 76 % injection (100 mLs Intravenous Contrast Given 01/15/17 0753)  furosemide (LASIX) injection 40 mg (40 mg Intravenous Given 01/15/17 1026)  ibuprofen (ADVIL,MOTRIN) tablet 800 mg (800 mg Oral Given 01/15/17 1243)     Initial Impression / Assessment and Plan / ED Course  I have reviewed the triage vital signs and the nursing notes.  Pertinent labs & imaging results that were available during my care of the patient were reviewed by me and considered in my medical decision making (see chart for details).     CBC, BMP, troponin unremarkable. BNP 288.5. 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 Patient was placed on BiPAP in the emergency department due to worsening tachypnea and hypoxia. Patient was first placed on 5 L nasal cannula, however patient was still tachypneic Sethi transitioned to BiPAP was made. IV Lasix 40 mg was also given in the ED. Patient was evaluated by critical care physician, Dr. Craige CottaSood, who will admit the patient. Cardiology  will also evaluate the patient. An echocardiogram was ordered and is pending. Patient stabilized in the emergency department and admitted to the ICU. Patient also evaluated by Dr. Clarene DukeLittle who guided the patient's management and agrees with plan. Patient and family understand and agree with plan.  Final Clinical Impressions(s) / ED Diagnoses   Final diagnoses:  Shortness of breath  Hemoptysis  Acute respiratory failure with hypoxia [facility](HCC)    New Prescriptions Current Discharge Medication List         Verdis PrimeLaw, Josiane Labine M, PA-C 01/15/17 1317    Little, Ambrose Finlandachel Balazs, MD 01/16/17 [phone]

## 2017-01-15 NOTE — ED Notes (Signed)
O2 sat 88% on room air, pt placed on 2L O2 via [place], O2 improved to 93%. PA Law made aware

## 2017-01-16 ENCOUNTER — Inpatient Hospital Stay (HOSPITAL_COMMUNITY): Payer: Medicaid Other

## 2017-01-16 DIAGNOSIS — J810 Acute pulmonary edema: Secondary | ICD-10-CM

## 2017-01-16 LAB — COMPREHENSIVE METABOLIC PANEL
ALBUMIN: 2.8 g/dL — AB (ref 3.5–5.0)
ALT: 174 U/L — ABNORMAL HIGH (ref 14–54)
ANION GAP: 9 (ref 5–15)
AST: 39 U/L (ref 15–41)
Alkaline Phosphatase: 78 U/L (ref 38–126)
BILIRUBIN TOTAL: 0.5 mg/dL (ref 0.3–1.2)
BUN: 12 mg/dL (ref 6–20)
CHLORIDE: 105 mmol/L (ref 101–111)
CO2: 26 mmol/L (ref 22–32)
Calcium: 8.3 mg/dL — ABNORMAL LOW (ref 8.9–10.3)
Creatinine, Ser: 0.82 mg/dL (ref 0.44–1.00)
GFR calc Af Amer: >60 mL/min (ref >60)
GFR calc non Af Amer: >60 mL/min (ref >60)
GLUCOSE: 93 mg/dL (ref 65–99)
POTASSIUM: 3.6 mmol/L (ref 3.5–5.1)
Sodium: 140 mmol/L (ref 135–145)
TOTAL PROTEIN: 5.8 g/dL — AB (ref 6.5–8.1)

## 2017-01-16 LAB — CBC
HEMATOCRIT: 39.7 % (ref 36.0–46.0)
HEMOGLOBIN: 12.9 g/dL (ref 12.0–15.0)
MCH: 28.1 pg (ref 26.0–34.0)
MCHC: 32.5 g/dL (ref 30.0–36.0)
MCV: 86.5 fL (ref 78.0–100.0)
Platelets: 250 10*3/uL (ref 150–400)
RBC: 4.59 MIL/uL (ref 3.87–5.11)
RDW: 13.7 % (ref 11.5–15.5)
WBC: 7.4 10*3/uL (ref 4.0–10.5)

## 2017-01-16 MED ORDER — PANTOPRAZOLE SODIUM 40 MG PO TBEC
40.0000 mg | DELAYED_RELEASE_TABLET | Freq: Every day | ORAL | Status: DC
  Administered 2017-01-16 – 2017-01-17 (×2): 40 mg via ORAL
  Filled 2017-01-16 (×2): qty 1

## 2017-01-16 MED ORDER — POTASSIUM CHLORIDE CRYS ER 20 MEQ PO TBCR
40.0000 meq | EXTENDED_RELEASE_TABLET | Freq: Once | ORAL | Status: AC
  Administered 2017-01-16: 40 meq via ORAL
  Filled 2017-01-16: qty 2

## 2017-01-16 MED ORDER — ZOLPIDEM TARTRATE 5 MG PO TABS: 5.0000 mg | ORAL_TABLET | Freq: Once | ORAL | Status: DC

## 2017-01-16 MED ORDER — ZOLPIDEM TARTRATE 5 MG PO TABS
5.0000 mg | ORAL_TABLET | Freq: Every evening | ORAL | Status: DC | PRN
  Administered 2017-01-16: 5 mg via ORAL
  Filled 2017-01-16: qty 1

## 2017-01-16 MED ORDER — SODIUM CHLORIDE 0.9 % IV SOLN: INTRAVENOUS | Status: DC | PRN

## 2017-01-16 MED ORDER — FUROSEMIDE 10 MG/ML IJ SOLN
40.0000 mg | Freq: Once | INTRAMUSCULAR | Status: AC
  Administered 2017-01-16: 40 mg via INTRAVENOUS
  Filled 2017-01-16: qty 4

## 2017-01-16 NOTE — Progress Notes (Signed)
Progress Note  Patient Name: Mary [name] Date of Encounter: 01/16/2017  Primary Cardiologist: Eden EmmsNishan  Subjective   Less dyspnea no chest pain or palpitations   Inpatient Medications    Scheduled Meds: . enoxaparin (LOVENOX) injection  40 mg Subcutaneous Q24H  . LORazepam  0.5 mg Intravenous Once  . mouth rinse  15 mL Mouth Rinse BID  . pantoprazole (PROTONIX) IV  40 mg Intravenous Q24H   Continuous Infusions: . sodium chloride     PRN Meds: ibuprofen, ondansetron   Vital Signs    Vitals:   01/16/17 0500 01/16/17 0600 01/16/17 0700 01/16/17 0800  BP: 125/80 136/80 120/67 (!) 148/78  Pulse: (!) 49 (!) 50 (!) 52 (!) 49  Resp: (!) 23 (!) 9 (!) 30 (!) 22  Temp:   98.5 F (36.9 C)   TempSrc:   Oral   SpO2: 98% 95% 94% 96%  Weight:  195 lb 4.8 oz (88.6 kg)    Height:        Intake/Output Summary (Last 24 hours) at 01/16/17 [id number] Last data filed at 01/15/17 2000  Gross per 24 hour  Intake              240 ml  Output              800 ml  Net             -560 ml   Filed Weights   01/15/17 1300 01/16/17 0600  Weight: 197 lb 8.5 oz (89.6 kg) 195 lb 4.8 oz (88.6 kg)    Telemetry    NSR 01/16/2017  - Personally Reviewed  ECG    SR no acute ST changes  - Personally Reviewed  Physical Exam  Overweight black female  GEN: No acute distress.   Neck: No JVD Cardiac: RRR, no murmurs, rubs, or gallops.  Respiratory: Clear to auscultation bilaterally. GI: Soft, nontender, non-distended  MS: No edema; No deformity. Neuro:  Nonfocal  Psych: Normal affect   Labs    Chemistry  Recent Labs Lab 01/15/17 0638 01/16/17 0413  NA 138 140  K 3.7 3.6  CL 107 105  CO2 21* 26  GLUCOSE 85 93  BUN 13 12  CREATININE 0.80 0.82  CALCIUM 8.5* 8.3*  PROT  --  5.8*  ALBUMIN  --  2.8*  AST  --  39  ALT  --  174*  ALKPHOS  --  78  BILITOT  --  0.5  GFRNONAA >60 >60  GFRAA >60 >60  ANIONGAP 10 9     Hematology  Recent Labs Lab 01/15/17 0638 01/16/17 0413  WBC  8.8 7.4  RBC 4.19 4.59  HGB 11.9* 12.9  HCT 35.9* 39.7  MCV 85.7 86.5  MCH 28.4 28.1  MCHC 33.1 32.5  RDW 13.7 13.7  PLT 230 250    Cardiac EnzymesNo results for input(s): TROPONINI in the last 168 hours.   Recent Labs Lab 01/15/17 0642  TROPIPOC 0.01     BNP  Recent Labs Lab 01/15/17 0638  BNP 288.5*     DDimer No results for input(s): DDIMER in the last 168 hours.   Radiology    Ct Angio Chest Pe W Or Wo Contrast  Result Date: 01/15/2017 CLINICAL DATA:  Shortness of breath and hemoptysis EXAM: CT ANGIOGRAPHY CHEST WITH CONTRAST TECHNIQUE: Multidetector CT imaging of the chest was performed using the standard protocol during bolus administration of intravenous contrast. Multiplanar CT image reconstructions and MIPs were obtained to evaluate  the vascular anatomy. CONTRAST:  100 cc Isovue 370 intravenous COMPARISON:  None. FINDINGS: Cardiovascular: Satisfactory opacification of the pulmonary arteries to the segmental level. No evidence of pulmonary embolism. Borderline heart size. No pericardial effusion. Mediastinum/Nodes: Negative for adenopathy. Prominent thymus without discrete mass, 22 mm in AP dimension. Lungs/Pleura: Symmetric bilateral airspace disease with septal thickening and small layering effusions. Negative for air leak. Upper Abdomen: Negative Musculoskeletal: Negative Review of the MIP images confirms the above findings. IMPRESSION: 1. Extensive symmetric airspace disease with septal thickening and pleural effusions, favor cardiogenic edema. 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 Diffuse alveolar hemorrhage is a consideration given the history of hemoptysis. Multi lobar pneumonia could have this appearance in the appropriate clinical setting. 2. Negative for pulmonary embolism. 3. Hyperplastic appearance of the thymus. Electronically Signed   By: Marnee Spring M.D.   On: 01/15/2017 08:36   Dg Chest Port 1 View  Result Date: 01/16/2017 CLINICAL DATA:  Shortness of  breath and acute pulmonary edema EXAM: PORTABLE CHEST 1 VIEW COMPARISON:  01/15/2017 FINDINGS: Cardiac shadow is enlarged. The lungs are well aerated bilaterally with patchy airspace opacities right greater than left but improved from the prior exam consistent with improving edema. No sizable effusion is seen. No bony abnormality is noted. IMPRESSION: Persistent bilateral opacities right greater than left although improved from the prior exam. Continued follow-up is recommended. Electronically Signed   By: Alcide Clever M.D.   On: 01/16/2017 07:29    Cardiac Studies   Echo:  EF 60-65% no congenital or valve disease no pulmonary HTN  Patient Profile     31 y.o. female post partum with dyspnea and abnormal CXR/CT Echo does Not show post partum DCM  CTA no emboli.    Assessment & Plan    1) Dyspnea.  BNP minimally elevated echo normal EF improved with lasix but CXR still Abnormal this am will give another dose iv lasix this am repeat BNP in am Further w/u  For non cardiac etiology per critical care Ok to ambulate in unit  Signed, Charlton Haws, MD  01/16/2017, 9:07 AM

## 2017-01-16 NOTE — Progress Notes (Signed)
eLink Physician-Brief Progress Note Patient Name: Annitta NeedsCheria Vines DOB: 01/18/1986 MRN: [id number]   Date of Service  01/16/2017  HPI/Events of Note  Insomnia  eICU Interventions  Ambien 5 mg po qHS PRN     Intervention Category Minor Interventions: Routine modifications to care plan (e.g. PRN medications for pain, fever)  Mailey Landstrom 01/16/2017, 11:39 PM

## 2017-01-16 NOTE — Progress Notes (Signed)
PCCM Progress Note  Admission date: 01/15/2017 Referring provider: Glenford Bayley, ER  CC: Short of breath  HPI: 31 yo female former with 3rd pregnancy had normal vaginal delivery on 01/08/17.  She was d/c home 01/10/17.  On 01/13/17 she started feeling short of breath and chest discomfort.  This became progressively worse.  She started getting cough with clear sputum, but then started having blood streaks in sputum.  Found to have acute pulmonary edema.  Subjective: Feels better.  Denies chest pain, cough, dyspnea, nausea, palpitations.  Vital signs: BP (!) 148/78   Pulse (!) 49   Temp 98.5 F (36.9 C) (Oral)   Resp (!) 22   Ht 5\' 5"  (1.651 m)   Wt 195 lb 4.8 oz (88.6 kg)   SpO2 96%   BMI 32.50 kg/m   Intake/output: I/O last 3 completed shifts: In: 240 [P.O.:240] Out: 800 [Urine:800]  General - pleasant Eyes - pupils reactive ENT - no sinus tenderness, no oral exudate, no LAN Cardiac - regular, no murmur Chest - no wheeze, rales Abd - soft, non tender Ext - no edema Skin - no rashes Neuro - normal strength Psych - normal mood    CMP Latest Ref Rng & Units 01/16/2017 01/15/2017 02/15/2008  Glucose 65 - 99 mg/dL 93 85 96  BUN 6 - 20 mg/dL 12 13 7   Creatinine 0.44 - 1.00 mg/dL 4.09 8.11 0.9  Sodium 914 - 145 mmol/L 140 138 144  Potassium 3.5 - 5.1 mmol/L 3.6 3.7 3.4(L)  Chloride 101 - 111 mmol/L 105 107 110  CO2 22 - 32 mmol/L 26 21(L) -  Calcium 8.9 - 10.3 mg/dL 8.3(L) 8.5(L) -  Total Protein 6.5 - 8.1 g/dL 7.8(G) - -  Total Bilirubin 0.3 - 1.2 mg/dL 0.5 - -  Alkaline Phos 38 - 126 U/L 78 - -  AST 15 - 41 U/L 39 - -  ALT 14 - 54 U/L 174(H) - -     CBC Latest Ref Rng & Units 01/16/2017 01/15/2017 01/08/2017  WBC 4.0 - 10.5 K/uL 7.4 8.8 9.2  Hemoglobin 12.0 - 15.0 g/dL 95.6 11.9(L) 11.9(L)  Hematocrit 36.0 - 46.0 % 39.7 35.9(L) 36.0  Platelets 150 - 400 K/uL 250 230 198     ABG    Component Value Date/Time   TCO2 20 02/15/2008 2210     CBG (last 3)  No results for  input(s): GLUCAP in the last 72 hours.   Imaging: Ct Angio Chest Pe W Or Wo Contrast  Result Date: 01/15/2017 CLINICAL DATA:  Shortness of breath and hemoptysis EXAM: CT ANGIOGRAPHY CHEST WITH CONTRAST TECHNIQUE: Multidetector CT imaging of the chest was performed using the standard protocol during bolus administration of intravenous contrast. Multiplanar CT image reconstructions and MIPs were obtained to evaluate the vascular anatomy. CONTRAST:  100 cc Isovue 370 intravenous COMPARISON:  None. FINDINGS: Cardiovascular: Satisfactory opacification of the pulmonary arteries to the segmental level. No evidence of pulmonary embolism. Borderline heart size. No pericardial effusion. Mediastinum/Nodes: Negative for adenopathy. Prominent thymus without discrete mass, 22 mm in AP dimension. Lungs/Pleura: Symmetric bilateral airspace disease with septal thickening and small layering effusions. Negative for air leak. Upper Abdomen: Negative Musculoskeletal: Negative Review of the MIP images confirms the above findings. IMPRESSION: 1. Extensive symmetric airspace disease with septal thickening and pleural effusions, favor cardiogenic edema. 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 Diffuse alveolar hemorrhage is a consideration given the history of hemoptysis. Multi lobar pneumonia could have this appearance in the appropriate clinical setting. 2. Negative  for pulmonary embolism. 3. Hyperplastic appearance of the thymus. Electronically Signed   By: Marnee SpringJonathon  Watts M.D.   On: 01/15/2017 08:36   Dg Chest Port 1 View  Result Date: 01/16/2017 CLINICAL DATA:  Shortness of breath and acute pulmonary edema EXAM: PORTABLE CHEST 1 VIEW COMPARISON:  01/15/2017 FINDINGS: Cardiac shadow is enlarged. The lungs are well aerated bilaterally with patchy airspace opacities right greater than left but improved from the prior exam consistent with improving edema. No sizable effusion is seen. No bony abnormality is noted. IMPRESSION: Persistent  bilateral opacities right greater than left although improved from the prior exam. Continued follow-up is recommended. Electronically Signed   By: Alcide CleverMark  Lukens M.D.   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  Cause is uncertain.  Assessment/plan:  Acute hypoxic respiratory failure 2nd to acute pulmonary edema. - lasix again 6/07 - f/u CXR, BMET - d/c Bipap - oxygen prn to keep SpO2 > 92%  Hx of GERD. - protonix  DVT prophylaxis - lovenox SUP - protonix Nutrition - regular diet Goals of care - full code  Transfer to telemetry 6/07 >> keep on PCCM service since she will likely be ready for d/c home in next 24 hours  D/w Dr. Lupita RaiderNishan  Cherissa Hook, MD [facility]/Critical Care 01/16/2017, 9:45 AM Pager:  [phone] After 3pm call: [phone]

## 2017-01-16 NOTE — Care Management Note (Signed)
Case Management Note Donn PieriniKristi Darlinda Bellows RN, BSN Unit 2W-Case Manager-- 2H coverage [phone]  Patient Details  Name: Mary NeedsCheria [name] MRN: [id number] Date of Birth: 05/01/1986  Subjective/Objective:  Pt admitted with pulm. Edema- post pregnancy and normal delivery on 01/08/17                  Action/Plan: PTA pt lived at home with family- anticipate return home- CM to follow for d/c [name]  Expected Discharge Date:                  Expected Discharge Plan:     In-House Referral:     Discharge planning Services  CM Consult  Post Acute Care Choice:    Choice offered to:     DME Arranged:    DME Agency:     HH Arranged:    HH Agency:     Status of Service:     If discussed at [employer] Meetings, dates discussed:    Additional Comments:  Darrold SpanWebster, Mary Hokenson Hall, RN 01/16/2017, 12:00 PM

## 2017-01-17 ENCOUNTER — Inpatient Hospital Stay (HOSPITAL_COMMUNITY): Payer: Medicaid Other

## 2017-01-17 LAB — BASIC METABOLIC PANEL
Anion gap: 8 (ref 5–15)
BUN: 14 mg/dL (ref 6–20)
CHLORIDE: 106 mmol/L (ref 101–111)
CO2: 25 mmol/L (ref 22–32)
CREATININE: 0.79 mg/dL (ref 0.44–1.00)
Calcium: 8.3 mg/dL — ABNORMAL LOW (ref 8.9–10.3)
GFR calc Af Amer: >60 mL/min (ref >60)
GFR calc non Af Amer: >60 mL/min (ref >60)
Glucose, Bld: 91 mg/dL (ref 65–99)
POTASSIUM: 4 mmol/L (ref 3.5–5.1)
Sodium: 139 mmol/L (ref 135–145)

## 2017-01-17 LAB — MAGNESIUM: MAGNESIUM: 2 mg/dL (ref 1.7–2.4)

## 2017-01-17 NOTE — Progress Notes (Signed)
Pt discharged via wheel chair with nursing staff. 

## 2017-01-17 NOTE — Progress Notes (Signed)
Progress Note  Patient Name: Mary [name] NeedsCheria Ager Date of Encounter: 01/17/2017  Primary Cardiologist: P. Eden EmmsNishan, MD   Subjective   Dyspnea resolved.  O2 sats stable overnight. Eager to go home.  Inpatient Medications    Scheduled Meds: . enoxaparin (LOVENOX) injection  40 mg Subcutaneous Q24H  . mouth rinse  15 mL Mouth Rinse BID  . pantoprazole  40 mg Oral Q1200   Continuous Infusions: . sodium chloride     PRN Meds: sodium chloride, ibuprofen, ondansetron, zolpidem   Vital Signs    Vitals:   01/16/17 2125 01/17/17 0036 01/17/17 0536 01/17/17 0829  BP: 124/73 129/66 129/82 114/65  Pulse: 64 60 63   Resp: 18 17 17    Temp: 98.2 F (36.8 C) 98 F (36.7 C) 97.7 F (36.5 C) 98.4 F (36.9 C)  TempSrc: Oral Oral Oral Oral  SpO2: 100% 98% 97%   Weight:   195 lb 6.4 oz (88.6 kg)   Height:        Intake/Output Summary (Last 24 hours) at 01/17/17 1015 Last data filed at 01/17/17 0831  Gross per 24 hour  Intake             1080 ml  Output             2550 ml  Net            -1470 ml   Filed Weights   01/16/17 0600 01/16/17 1314 01/17/17 0536  Weight: 195 lb 4.8 oz (88.6 kg) 194 lb 1.6 oz (88 kg) 195 lb 6.4 oz (88.6 kg)    Physical Exam   GEN: Well nourished, well developed, in no acute distress.  HEENT: Grossly normal.  Neck: Supple, no JVD, carotid bruits, or masses. Cardiac: RRR, no murmurs, rubs, or gallops. No clubbing, cyanosis, edema.  Radials/DP/PT 2+ and equal bilaterally.  Respiratory:  Respirations regular and unlabored, clear to auscultation bilaterally. GI: Soft, nontender, nondistended, BS + x 4. MS: no deformity or atrophy. Skin: warm and dry, no rash. Neuro:  Strength and sensation are intact. Psych: AAOx3.  Normal affect.  Labs    Chemistry Recent Labs Lab 01/15/17 [id number] 01/16/17 0413 01/17/17 0317  NA 138 140 139  K 3.7 3.6 4.0  CL 107 105 106  CO2 21* 26 25  GLUCOSE 85 93 91  BUN 13 12 14   CREATININE 0.80 0.82 0.79  CALCIUM 8.5* 8.3*  8.3*  PROT  --  5.8*  --   ALBUMIN  --  2.8*  --   AST  --  39  --   ALT  --  174*  --   ALKPHOS  --  78  --   BILITOT  --  0.5  --   GFRNONAA >60 >60 >60  GFRAA >60 >60 >60  ANIONGAP 10 9 8      Hematology Recent Labs Lab 01/15/17 0638 01/16/17 0413  WBC 8.8 7.4  RBC 4.19 4.59  HGB 11.9* 12.9  HCT 35.9* 39.7  MCV 85.7 86.5  MCH 28.4 28.1  MCHC 33.1 32.5  RDW 13.7 13.7  PLT 230 250     Recent Labs Lab 01/15/17 0642  TROPIPOC 0.01     BNP Recent Labs Lab 01/15/17 0638  BNP 288.5*      Radiology    Dg Chest 2 View  Result Date: 01/17/2017 CLINICAL DATA:  31 year old female with recent pulmonary edema following childbirth. Follow-up evaluation. EXAM: CHEST  2 VIEW COMPARISON:  Prior chest x-ray 01/16/2017 FINDINGS: The  lungs are clear and negative for focal airspace consolidation, pulmonary edema or suspicious pulmonary nodule. No pleural effusion or pneumothorax. Cardiac and mediastinal contours are within normal limits. No acute fracture or lytic or blastic osseous lesions. The visualized upper abdominal bowel gas pattern is unremarkable. IMPRESSION: Normal chest x-ray.  Interval resolution of pulmonary edema. Electronically Signed   By: Malachy Moan M.D.   On: 01/17/2017 09:15   Dg Chest Port 1 View  Result Date: 01/16/2017 CLINICAL DATA:  Shortness of breath and acute pulmonary edema EXAM: PORTABLE CHEST 1 VIEW COMPARISON:  01/15/2017 FINDINGS: Cardiac shadow is enlarged. The lungs are well aerated bilaterally with patchy airspace opacities right greater than left but improved from the prior exam consistent with improving edema. No sizable effusion is seen. No bony abnormality is noted. IMPRESSION: Persistent bilateral opacities right greater than left although improved from the prior exam. Continued follow-up is recommended. Electronically Signed   By: Alcide Clever M.D.   On: 01/16/2017 07:29    Telemetry    Sinus brady to rsr - Personally  Reviewed   Cardiac Studies   2D Echocardiogram 6.6.2018  Study Conclusions   - Left ventricle: The cavity size was normal. Systolic function was   normal. The estimated ejection fraction was in the range of 55%   to 60%. Wall motion was normal; there were no regional wall   motion abnormalities. Left ventricular diastolic function   parameters were normal. - Aortic valve: There was trivial regurgitation. Valve area (Vmax):   2.24 cm^2. - Atrial septum: No defect or patent foramen ovale was identified.   Patient Profile     31 y.o. female s/p recent delivery of her third child, who was admitted with dyspnea and evidence of pulmonary edema.  Assessment & Plan    1.  Dyspnea:  No further dyspnea.  EF is normal by echo.  No diastolic dysfxn.  She notes that she has been eating more fast food since about 2 wks prior to delivery of her child.  ? Role of increased salt load.  Edema improved w/ lasix.  Rec lasix 20 mg daily prn swelling/dyspnea @ discharge.  No further cardiac w/u.  Signed, Nicolasa Ducking, NP  01/17/2017, 10:15 AM    Patient examined chart reviewed Lungs clear with improved CXR d/c with PRN lasix Etiology of event still not clear No postpartum DCM, eclampsia, infection No signs of connective tissue disease.  Discussed low sodium diet  Charlton Haws

## 2017-01-17 NOTE — Discharge Summary (Signed)
Physician Discharge Summary  Patient ID: Mary [name] MRN: [id number] DOB/AGE: Apr 11, 1986 31 y.o.  Admit date: 01/15/2017 Discharge date: 01/17/2017    Discharge Diagnoses:  Acute hypoxic respiratory failure secondary to acute pulmonary edema - unclear etiology.  Resolved after lasix and BiPAP. S/p vaginal delivery 01/10/17. Hx GERD.                                                                       DISCHARGE PLAN BY DIAGNOSIS    Acute hypoxic respiratory failure secondary to acute pulmonary edema - unclear etiology.  Resolved after lasix and BiPAP. Plan: No further interventions. Encouraged ambulation / mobilization. Pt instructed to see PCP if starts having recurrent symptoms so that she can get more lasix and hopefully avoid hospitalization.  S/p vaginal delivery 01/10/17. Plan: Continue pre-natals. F/u with OB as planned.  Hx GERD. Plan: Continue preadmission Ranitidine.                 DISCHARGE SUMMARY   Mary [name] is a 31 y.o. y/o female with a PMH of GERD and anxiety, had normal vaginal delivery on 01/08/17.  She was d/c home 01/10/17.  Did not have any trouble with blood sugar, blood pressure, protein in urine, or swelling during pregnancy.  She did report having reflux during pregnancy and had episode of vomiting during delivery, but didn't feel like she swallowed anything down the wrong pipe.  On 01/13/17 she started feeling short of breath and chest discomfort.  This  became progressively worse.  She started getting cough with clear sputum, but then started having blood streaks in sputum.  She subsequently came to ED for further evaluation.  She denies tobacco or illicit drug use.  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  PLT and LFTs were okay.  She was started on bipap and given lasix.  She responded well to these and was transitioned to room air without difficulty the following day.  She received an additional dose of lasix before CXR showed  resolution of edema.  Echo was obtained and was normal without anything to suggest post partum DCM.   SIGNIFICANT DIAGNOSTIC STUDIES CTA chest 6/6 > symmetric airspace disease with septal thickening and pleural effusions, favoring cardiogenic edema.  Negative for PE. Echo 6/6 > EF 55-60%.  SIGNIFICANT EVENTS 6/6 > admit. 6/8 > discharge.  CONSULTS Cardiology.   Discharge Exam: General: Pleasant young female, resting in bed, in NAD. Neuro: A&O x 3, non-focal.  HEENT: [place]/AT. PERRL, sclerae anicteric. Cardiovascular: RRR, no M/R/G.  Lungs: Respirations even and unlabored.  CTA bilaterally, No W/R/R. Abdomen: BS x 4, soft, NT/ND.  Musculoskeletal: No gross deformities, no edema.  Skin: Intact, warm, no rashes.   Vitals:   01/16/17 2125 01/17/17 0036 01/17/17 0536 01/17/17 0829  BP: 124/73 129/66 129/82 114/65  Pulse: 64 60 63   Resp: _0 Temp: 98.2 F (36.8 C) 98 F (36.7 C) 97.7 F (36.5 C) 98.4 F (36.9 C)  TempSrc: Oral Oral Oral Oral  SpO2: 100% 98% 97%   Weight:   88.6 kg (195 lb 6.4 oz)   Height:         Discharge Labs  BMET  Recent Labs Lab 01/15/17  3903 01/16/17 0413 01/17/17 0317  NA 138 140 139  K 3.7 3.6 4.0  CL 107 105 106  CO2 21* 26 25  GLUCOSE 85 93 91  BUN _0 CREATININE 0.80 0.82 0.79  CALCIUM 8.5* 8.3* 8.3*  MG  --   --  2.0    CBC  Recent Labs Lab 01/15/17 0638 01/16/17 0413  HGB 11.9* 12.9  HCT 35.9* 39.7  WBC 8.8 7.4  PLT 230 250    Anti-Coagulation No results for input(s): INR in the last 168 hours.  Discharge Instructions    Diet - low sodium heart healthy    Complete by:  As directed    Increase activity slowly    Complete by:  As directed           Allergies as of 01/17/2017   No Known Allergies     Medication List    TAKE these medications   ibuprofen 600 MG tablet Commonly known as:  ADVIL,MOTRIN Take 1 tablet (600 mg total) by mouth every 6 (six) hours.   multivitamin-prenatal  27-0.8 MG Tabs tablet Take 1 tablet by mouth daily at 12 noon.   oxyCODONE 5 MG immediate release tablet Commonly known as:  Oxy IR/ROXICODONE Take 1 tablet (5 mg total) by mouth every 4 (four) hours as needed for moderate pain.   ranitidine 150 MG tablet Commonly known as:  ZANTAC Take 150 mg by mouth 2 (two) times daily as needed for heartburn.        Disposition: Home.  Discharged Condition: [name] has met maximum benefit of inpatient care and is medically stable and cleared for discharge.  Patient is pending follow up as above.      Time spent on disposition:  Greater than 35 minutes.   Montey Hora, [facility] Pgr: [phone]  or [phone]   STAFF NOTE: I, Merrie Roof, MD FACP have personally reviewed patient's available data, including medical history, events of note, physical examination and test results as part of my evaluation. I have discussed with resident/NP and other care providers such as pharmacist, RN and RRT. In addition, I personally evaluated patient and elicited key findings of: awake, alert, no distress, lungs resolved coarse BS, no crackles, JVD wnl, no edema, unclear NONcardiogenic pulm edema likely, resolved with lasix and tolerated well, will need close follow up with PCP, may need halter if re occurs and repeat echo assessment, ambulating well, differential is broad, infection viral? Residual plasma pregnancy volume affect?  Lavon Paganini. Titus Mould, MD, [place] Pgr: [facility] 01/17/2017 11:27 AM

## 2017-01-17 NOTE — Progress Notes (Signed)
Pt IV discontinued, catheter intact and telemetry removed. Pt discharge education provided at bedside. Pt has all belongings. Awaiting pt transportation

## 2017-01-31 ENCOUNTER — Emergency Department (HOSPITAL_COMMUNITY)
Admission: EM | Admit: 2017-01-31 | Discharge: 2017-01-31 | Disposition: A | Discharge status: Home / Self Care | Payer: Medicaid Other | Attending: Emergency Medicine | Admitting: Emergency Medicine

## 2017-01-31 ENCOUNTER — Encounter (HOSPITAL_COMMUNITY): Payer: Self-pay | Admitting: Emergency Medicine

## 2017-01-31 ENCOUNTER — Emergency Department (HOSPITAL_COMMUNITY): Payer: Medicaid Other

## 2017-01-31 DIAGNOSIS — Y939 Activity, unspecified: Secondary | ICD-10-CM | POA: Diagnosis not present

## 2017-01-31 DIAGNOSIS — Y999 Unspecified external cause status: Secondary | ICD-10-CM | POA: Diagnosis not present

## 2017-01-31 DIAGNOSIS — S51811A Laceration without foreign body of right forearm, initial encounter: Secondary | ICD-10-CM | POA: Diagnosis not present

## 2017-01-31 DIAGNOSIS — W25XXXA Contact with sharp glass, initial encounter: Secondary | ICD-10-CM | POA: Insufficient documentation

## 2017-01-31 DIAGNOSIS — Z79899 Other long term (current) drug therapy: Secondary | ICD-10-CM | POA: Diagnosis not present

## 2017-01-31 DIAGNOSIS — Y929 Unspecified place or not applicable: Secondary | ICD-10-CM | POA: Diagnosis not present

## 2017-01-31 DIAGNOSIS — S61512A Laceration without foreign body of left wrist, initial encounter: Secondary | ICD-10-CM | POA: Diagnosis not present

## 2017-01-31 DIAGNOSIS — S61411A Laceration without foreign body of right hand, initial encounter: Secondary | ICD-10-CM

## 2017-01-31 DIAGNOSIS — S61001A Unspecified open wound of right thumb without damage to nail, initial encounter: Secondary | ICD-10-CM

## 2017-01-31 MED ORDER — TETANUS-DIPHTH-ACELL PERTUSSIS 5-2.5-18.5 LF-MCG/0.5 IM SUSP
0.5000 mL | Freq: Once | INTRAMUSCULAR | Status: AC
  Administered 2017-01-31: 0.5 mL via INTRAMUSCULAR
  Filled 2017-01-31: qty 0.5

## 2017-01-31 MED ORDER — BACITRACIN ZINC 500 UNIT/GM EX OINT: 1.0000 "application " | TOPICAL_OINTMENT | Freq: Two times a day (BID) | CUTANEOUS | 0 refills | Status: DC

## 2017-01-31 MED ORDER — OXYCODONE-ACETAMINOPHEN 5-325 MG PO TABS: 1.0000 | ORAL_TABLET | Freq: Four times a day (QID) | ORAL | 0 refills | Status: AC | PRN

## 2017-01-31 MED ORDER — IBUPROFEN 800 MG PO TABS
800.0000 mg | ORAL_TABLET | Freq: Once | ORAL | Status: AC
  Administered 2017-01-31: 800 mg via ORAL
  Filled 2017-01-31: qty 1

## 2017-01-31 MED ORDER — BACITRACIN ZINC 500 UNIT/GM EX OINT: 1.0000 "application " | TOPICAL_OINTMENT | Freq: Two times a day (BID) | CUTANEOUS | 0 refills | Status: AC

## 2017-01-31 MED ORDER — OXYCODONE-ACETAMINOPHEN 5-325 MG PO TABS
1.0000 | ORAL_TABLET | Freq: Once | ORAL | Status: AC
Start: 2017-01-31 — End: 2017-01-31
  Administered 2017-01-31: 1 via ORAL
  Filled 2017-01-31: qty 1

## 2017-01-31 MED ORDER — CEPHALEXIN 500 MG PO CAPS: 500.0000 mg | ORAL_CAPSULE | Freq: Four times a day (QID) | ORAL | 0 refills | Status: AC

## 2017-01-31 MED ORDER — CEPHALEXIN 500 MG PO CAPS: 500.0000 mg | ORAL_CAPSULE | Freq: Four times a day (QID) | ORAL | 0 refills | Status: DC

## 2017-01-31 MED ORDER — IBUPROFEN 800 MG PO TABS: 800.0000 mg | ORAL_TABLET | Freq: Three times a day (TID) | ORAL | 0 refills | Status: DC

## 2017-01-31 MED ORDER — OXYCODONE-ACETAMINOPHEN 5-325 MG PO TABS: 1.0000 | ORAL_TABLET | Freq: Four times a day (QID) | ORAL | 0 refills | Status: DC | PRN

## 2017-01-31 MED ORDER — LIDOCAINE HCL (PF) 1 % IJ SOLN
30.0000 mL | Freq: Once | INTRAMUSCULAR | Status: AC
  Administered 2017-01-31: 30 mL
  Filled 2017-01-31: qty 30

## 2017-01-31 MED ORDER — IBUPROFEN 800 MG PO TABS: 800.0000 mg | ORAL_TABLET | Freq: Three times a day (TID) | ORAL | 0 refills | Status: AC

## 2017-01-31 NOTE — ED Notes (Signed)
Pt verbalized understanding discharge instructions and denies any further needs or questions at this time. VS stable, ambulatory and steady gait.   

## 2017-01-31 NOTE — ED Triage Notes (Signed)
Pt states "i stuck both my hands through glass". Pt has tip of R thumb cut off, bleeding controlled, lacerations to R hand, Lac to R forearm, and superficial lac to L wrist.

## 2017-01-31 NOTE — ED Notes (Signed)
Pt returns from xray. VIS given to pt. PA at bedside.

## 2017-01-31 NOTE — Discharge Instructions (Signed)
Treatment: Keep your wound dry and dressings applied to the sutured wounds until this time tomorrow. After 24 hours, you may wash with warm soapy water. Dry and apply antibiotic ointment and clean dressing. Do this daily until your sutures are removed. Keep dressing applied to the tip of the thumb until you see Dr. Merlyn LotKuzma if it is on Monday or Tuesday. If you see him later, change dressing with nonstick dressing and gauze roll as we did in the emergency department today.  Follow-up: Please return to emergency department in ~10 days for suture removal. Be aware of signs of infection: fever, increasing pain, redness, swelling, drainage from the area. Please return to emergency department if you develop any of these symptoms, you experience excessive bleeding, or if any of the sutures come out prior to removal. Please return to the emergency department if you develop any other new or worsening symptoms.

## 2017-01-31 NOTE — ED Provider Notes (Signed)
_ [facility]-EMERGENCY DEPT Provider Note   CSN: [id number] Arrival date & time: 01/31/17  1556  By signing my name below, I, Doreatha MartinEva Mathews, attest that this documentation has been prepared under the direction and in the presence of  Buel ReamAlexandra [place], PA-C. Electronically Signed: Doreatha MartinEva Mathews, ED Scribe. 01/31/17. 4:25 PM.    History   Chief Complaint Chief Complaint  Patient presents with  . Extremity Laceration    HPI Mary [name] is a 31 y.o. female who presents to the Emergency Department complaining of significantly painful lacerations to the right thumb, right hand and left wrist that occurred earlier this afternoon. Pt states she pushed her door forcefully closed to keep a bee from getting in the house and both her hands went through the glass, causing her lacerations. She reports associated tingling to the right thumb and forearm. Bleeding controlled with dressings applied in the ED. No treatments tried PTA. She denies numbness, additional injuries. Tdap unknown.   The history is provided by the patient. No language interpreter was used.    Past Medical History:  Diagnosis Date  . Anxiety   . GERD (gastroesophageal reflux disease)   . Mental disorder     Patient Active Problem List   Diagnosis Date Noted  . Acute pulmonary edema (HCC)   . Acute respiratory failure (HCC) 01/15/2017  . Rupture of membranes with clear amniotic fluid 01/08/2017  . Supervision of normal pregnancy in third trimester 11/20/2016    Past Surgical History:  Procedure Laterality Date  . TONSILLECTOMY    . WISDOM TOOTH EXTRACTION      OB History    Gravida Para Term Preterm AB Living   5 3 3   1 3    SAB TAB Ectopic Multiple Live Births   1     0 3       Home Medications    Prior to Admission medications   Medication Sig Start Date End Date Taking? Authorizing Provider  bacitracin ointment Apply 1 application topically 2 (two) times daily. 01/31/17   Keyandra Swenson, Waylan BogaAlexandra M, PA-C  cephALEXin (KEFLEX)  500 MG capsule Take 1 capsule (500 mg total) by mouth 4 (four) times daily. 01/31/17   Bethzaida Boord, Waylan BogaAlexandra M, PA-C  ibuprofen (ADVIL,MOTRIN) 800 MG tablet Take 1 tablet (800 mg total) by mouth 3 (three) times daily. 01/31/17   Bernadean Saling, Waylan BogaAlexandra M, PA-C  oxyCODONE (OXY IR/ROXICODONE) 5 MG immediate release tablet Take 1 tablet (5 mg total) by mouth every 4 (four) hours as needed for moderate pain. 01/10/17   Hermina StaggersErvin, Michael L, MD  oxyCODONE-acetaminophen (PERCOCET/ROXICET) 5-325 MG tablet Take 1-2 tablets by mouth every 6 (six) hours as needed for severe pain. 01/31/17   Emi HolesLaw, [place] M, PA-C  Prenatal Vit-Fe Fumarate-FA (MULTIVITAMIN-PRENATAL) 27-0.8 MG TABS tablet Take 1 tablet by mouth daily at 12 noon.    [provider]  ranitidine (ZANTAC) 150 MG tablet Take 150 mg by mouth 2 (two) times daily as needed for heartburn.     [provider]    Family History Family History  Problem Relation Age of Onset  . Hypertension Father     Social History Social History  Substance Use Topics  . Smoking status: Never Smoker  . Smokeless tobacco: Never Used  . Alcohol use No     Allergies   Patient has no known allergies.   Review of Systems Review of Systems  Constitutional: Negative for chills and fever.  HENT: Negative for facial swelling and sore throat.  Respiratory: Negative for shortness of breath.   Cardiovascular: Negative for chest pain.  Gastrointestinal: Negative for abdominal pain, nausea and vomiting.  Genitourinary: Negative for dysuria.  Musculoskeletal: Negative for back pain.  Skin: Positive for wound. Negative for rash.  Neurological: Negative for numbness and headaches.       +tingling R thumb and forearm  Psychiatric/Behavioral: The patient is not nervous/anxious.      Physical Exam Updated Vital Signs BP (!) 134/99   Pulse 89   Temp 99.2 F (37.3 C) (Oral)   Resp 18   SpO2 95%   Physical Exam  Constitutional: She appears well-developed and  well-nourished. No distress.  HENT:  Head: Normocephalic and atraumatic.  Mouth/Throat: Oropharynx is clear and moist. No oropharyngeal exudate.  Eyes: Conjunctivae are normal. Pupils are equal, round, and reactive to light. Right eye exhibits no discharge. Left eye exhibits no discharge. No scleral icterus.  Neck: Normal range of motion. Neck supple. No thyromegaly present.  Cardiovascular: Normal rate, regular rhythm, normal heart sounds and intact distal pulses.  Exam reveals no gallop and no friction rub.   No murmur heard. Pulmonary/Chest: Effort normal and breath sounds normal. No stridor. No respiratory distress. She has no wheezes. She has no rales.  Abdominal: Soft. Bowel sounds are normal. She exhibits no distension. There is no tenderness. There is no rebound and no guarding.  Musculoskeletal: She exhibits no edema.  FROM with flexion and extension of the right thumb and all digits. Radial pulses intact. Complete fingertip avulsion to the right thumb. One 3 cm laceration and one 1 cm laceration to dorsal right hand over 1st metacarpal. 1 cm laceration on the volar aspect of the right PIP joint. 4 x 2 cm laceration with retracted tissue to right volar forearm. 1 cm laceration to the left volar wrist.  (see photos)  Lymphadenopathy:    She has no cervical adenopathy.  Neurological: She is alert. Coordination normal.  Distal sensation intact to the right hand.  Paraesthesias around wound on right forearm.   Skin: Skin is warm and dry. No rash noted. She is not diaphoretic. No pallor.  Psychiatric: She has a normal mood and affect.  Nursing note and vitals reviewed.            ED Treatments / Results   DIAGNOSTIC STUDIES: Oxygen Saturation is 95% on RA, adequate by my interpretation.    COORDINATION OF CARE: 4:13 PM Discussed treatment plan with pt at bedside which includes wound care, XR and pt agreed to plan.   Radiology Dg Wrist Complete Left  Result Date:  01/31/2017 CLINICAL DATA:  Pt pushed both her hands through a glass door today at 4 PM. She has bad cuts on her right thumb and thumb side of her right hand. Another cut on her right wrist. She also has a cut across her left wrist. The cut on her right hand below the thumb did have a pulse while it was bleeding. And the right thumb itself no longer has skin on the distal end. No prior surgeries or injuries. EXAM: LEFT WRIST - COMPLETE 3+ VIEW COMPARISON:  None. FINDINGS: No fracture.  No bone lesion. The joints are normally spaced and aligned.  No arthropathic change. No radiographic soft tissue abnormality or radiopaque foreign body. IMPRESSION: Negative. Electronically Signed   By: Amie Portland M.D.   On: 01/31/2017 18:43   Dg Wrist Complete Right  Result Date: 01/31/2017 CLINICAL DATA:  Pt pushed both her hands through a  glass door today at 4 PM. She has bad cuts on her right thumb and thumb side of her right hand. Another cut on her right wrist. She also has a cut across her left wrist. The cut on her right hand below the thumb did have a pulse while it was bleeding. And the right thumb itself no longer has skin on the distal end. No prior surgeries or injuries. EXAM: RIGHT WRIST - COMPLETE 3+ VIEW COMPARISON:  None. FINDINGS: No fracture.  No bone lesion. The joints are normally spaced and aligned.  No arthropathic change. The soft tissue injury to thenar aspect of the hand is noted. No other soft tissue abnormality. No radiopaque foreign body. IMPRESSION: No fracture, dislocation or radiopaque foreign body. Electronically Signed   By: Amie Portland M.D.   On: 01/31/2017 18:44   Dg Hand Complete Right  Result Date: 01/31/2017 CLINICAL DATA:  Pt pushed both her hands through a glass door today at 4 PM. She has bad cuts on her right thumb and thumb side of her right hand. Another cut on her right wrist. She also has a cut across her left wrist. The cut on her right hand below the thumb did have a pulse  while it was bleeding. And the right thumb itself no longer has skin on the distal end. No prior surgeries or injuries. EXAM: RIGHT HAND - COMPLETE 3+ VIEW COMPARISON:  None. FINDINGS: There is soft tissue injury to the thumb tip had along the radial margin at the base of the thumb, thenar aspect of the hand. There is some amputation soft tissue from the found tip and radial base of the thumb. No radiopaque foreign body. No fracture.  The joints are normally spaced and aligned. IMPRESSION: 1. Soft tissue injury with some soft tissue loss, to the tip of the right thumb and radial base of the right thumb extending to the thenar/radial aspect of the hand. No radiopaque foreign bodies. 2. No fracture or joint abnormality. Electronically Signed   By: Amie Portland M.D.   On: 01/31/2017 18:42    Procedures Procedures (including critical care time)  LACERATION REPAIR Performed by: Glenford Bayley, PA-C Consent: Verbal consent obtained. Risks and benefits: risks, benefits and alternatives were discussed Patient identity confirmed: provided demographic data Time out performed prior to procedure Prepped and Draped in normal sterile fashion Wound explored Laceration Location: right forearm Laceration Length: 4 x 2 cm No Foreign Bodies seen or palpated Anesthesia: local infiltration Local anesthetic: lidocaine 1% without epinephrine Anesthetic total: 6 ml Irrigation method: syringe Amount of cleaning: standard Skin closure: 5-0 fast absorbing gut and 5-0 ethilon  Number of sutures or staples: 9 superficial, 3 deep Technique: simple interrupted  Patient tolerance: Patient tolerated the procedure well with no immediate complications.   LACERATION REPAIR Performed by: Glenford Bayley, PA-C Consent: Verbal consent obtained. Risks and benefits: risks, benefits and alternatives were discussed Patient identity confirmed: provided demographic data Time out performed prior to procedure Prepped and Draped in normal  sterile fashion Wound explored Laceration Location: right hand Laceration Length: 3 cm No Foreign Bodies seen or palpated Anesthesia: local infiltration Local anesthetic: lidocaine 1% without epinephrine Anesthetic total: 4 ml Irrigation method: syringe Amount of cleaning: standard Skin closure: 5-0 Ethilon   Number of sutures or staples: 7 Technique: simple interrupted  Patient tolerance: Patient tolerated the procedure well with no immediate complications.   LACERATION REPAIR Performed by: Glenford Bayley, PA-C Consent: Verbal consent obtained. Risks and benefits: risks, benefits  and alternatives were discussed Patient identity confirmed: provided demographic data Time out performed prior to procedure Prepped and Draped in normal sterile fashion Wound explored Laceration Location: right hand Laceration Length: 1 cm No Foreign Bodies seen or palpated Anesthesia: local infiltration Local anesthetic: lidocaine 1% without epinephrine Anesthetic total: 2 ml Irrigation method: syringe Amount of cleaning: standard Skin closure: 5-0 ethilon  Number of sutures or staples: 3 Technique: simple interrupted  Patient tolerance: Patient tolerated the procedure well with no immediate complications.   LACERATION REPAIR Performed by: Glenford Bayley, PA-C Consent: Verbal consent obtained. Risks and benefits: risks, benefits and alternatives were discussed Patient identity confirmed: provided demographic data Time out performed prior to procedure Prepped and Draped in normal sterile fashion Wound explored Laceration Location: right thumb Laceration Length: 1 cm No Foreign Bodies seen or palpated Anesthesia: local infiltration Local anesthetic: lidocaine 1% without epinephrine Anesthetic total: 1 ml Irrigation method: syringe Amount of cleaning: standard Skin closure: 5-0 ethilon  Number of sutures or staples: 1 Technique: simple interrupted  Patient tolerance: Patient tolerated the procedure  well with no immediate complications.   LACERATION REPAIR Performed by: Glenford Bayley, PA-C Consent: Verbal consent obtained. Risks and benefits: risks, benefits and alternatives were discussed Patient identity confirmed: provided demographic data Time out performed prior to procedure Prepped and Draped in normal sterile fashion Wound explored Laceration Location: left wrist Laceration Length: 1 cm No Foreign Bodies seen or palpated Anesthesia: local infiltration Local anesthetic: lidocaine 1% without epinephrine Anesthetic total: 2 ml Irrigation method: syringe Amount of cleaning: standard Skin closure: 5-0 ethilon  Number of sutures or staples: 3 Technique: simple interrupted  Patient tolerance: Patient tolerated the procedure well with no immediate complications.   Medications Ordered in ED Medications  oxyCODONE-acetaminophen (PERCOCET/ROXICET) 5-325 MG per tablet 1 tablet (1 tablet Oral Given 01/31/17 1638)  lidocaine (PF) (XYLOCAINE) 1 % injection 30 mL (30 mLs Infiltration Given 01/31/17 1800)  Tdap (BOOSTRIX) injection 0.5 mL (0.5 mLs Intramuscular Given 01/31/17 1638)  ibuprofen (ADVIL,MOTRIN) tablet 800 mg (800 mg Oral Given 01/31/17 1957)     Initial Impression / Assessment and Plan / ED Course  I have reviewed the triage vital signs and the nursing notes.  Pertinent imaging results that were available during my care of the patient were reviewed by me and considered in my medical decision making (see chart for details).     Patient with several lacerations to bilateral upper extremities. Tdap booster given. XRs negative for fracture and foreign bleed. Pressure irrigation performed. No foreign bodies found in a bloodless field. Lacerations occurred < 8 hours prior to repair. Patient also evaluated by Dr. Ranae Palms who guided the patient's management and agrees with plan. Arterial bleed in R thumb avulsion repaired by Dr. Ranae Palms. Thumb avulsion dressed with xeroform, gauze  and Coban dressing. Extensive laceration repairs well tolerated. Pt has no co morbidities to effect normal wound healing. Discussed suture home care w pt and answered questions. Pt to f/u in office with hand surgery in 2 days for evaluation and wound care of fingertip avulsion. Pt to return for suture removal in 10-14 days.  D/c with Keflex, bacitracin, ibuprofen and short course of Percocet for pain control. I reviewed the [place] narcotic database and found no discrepancies. Pt is hemodynamically stable w no complaints prior to dc.     Final Clinical Impressions(s) / ED Diagnoses   Final diagnoses:  Avulsion of skin of thumb, right, initial encounter  Laceration of right hand without foreign body, initial  encounter  Forearm laceration, right, initial encounter  Laceration of left wrist, initial encounter    New Prescriptions New Prescriptions   BACITRACIN OINTMENT    Apply 1 application topically 2 (two) times daily.   CEPHALEXIN (KEFLEX) 500 MG CAPSULE    Take 1 capsule (500 mg total) by mouth 4 (four) times daily.   IBUPROFEN (ADVIL,MOTRIN) 800 MG TABLET    Take 1 tablet (800 mg total) by mouth 3 (three) times daily.   OXYCODONE-ACETAMINOPHEN (PERCOCET/ROXICET) 5-325 MG TABLET    Take 1-2 tablets by mouth every 6 (six) hours as needed for severe pain.    I personally performed the services described in this documentation, which was scribed in my presence. The recorded information has been reviewed and is accurate.    Emi Holes, PA-C 02/02/17 1750    Loren Racer, MD 02/07/17 [phone]

## 2017-02-17 ENCOUNTER — Ambulatory Visit: Payer: Medicaid Other | Admitting: Family Medicine

## 2017-02-27 ENCOUNTER — Ambulatory Visit: Payer: Medicaid Other | Admitting: Advanced Practice Midwife

## 2017-03-03 ENCOUNTER — Encounter: Payer: Self-pay | Admitting: Family Medicine

## 2017-03-03 ENCOUNTER — Ambulatory Visit: Payer: Medicaid Other | Admitting: Certified Nurse Midwife

## 2017-11-18 ENCOUNTER — Encounter (HOSPITAL_COMMUNITY): Payer: Self-pay

## 2017-11-18 ENCOUNTER — Emergency Department (HOSPITAL_COMMUNITY)
Admission: EM | Admit: 2017-11-18 | Discharge: 2017-11-18 | Disposition: A | Discharge status: Home / Self Care | Payer: Self-pay | Attending: Emergency Medicine | Admitting: Emergency Medicine

## 2017-11-18 ENCOUNTER — Emergency Department (HOSPITAL_COMMUNITY): Payer: Self-pay

## 2017-11-18 DIAGNOSIS — R0789 Other chest pain: Secondary | ICD-10-CM | POA: Insufficient documentation

## 2017-11-18 DIAGNOSIS — R05 Cough: Secondary | ICD-10-CM | POA: Insufficient documentation

## 2017-11-18 LAB — I-STAT TROPONIN, ED
Troponin i, poc: 0 ng/mL (ref 0.00–0.08)
Troponin i, poc: 0 ng/mL (ref 0.00–0.08)

## 2017-11-18 LAB — BASIC METABOLIC PANEL
ANION GAP: 10 (ref 5–15)
BUN: 13 mg/dL (ref 6–20)
CO2: 22 mmol/L (ref 22–32)
Calcium: 9.1 mg/dL (ref 8.9–10.3)
Chloride: 107 mmol/L (ref 101–111)
Creatinine, Ser: 0.8 mg/dL (ref 0.44–1.00)
GFR calc Af Amer: >60 mL/min (ref >60)
GLUCOSE: 111 mg/dL — AB (ref 65–99)
POTASSIUM: 3.8 mmol/L (ref 3.5–5.1)
Sodium: 139 mmol/L (ref 135–145)

## 2017-11-18 LAB — CBC
HEMATOCRIT: 42 % (ref 36.0–46.0)
HEMOGLOBIN: 13.9 g/dL (ref 12.0–15.0)
MCH: 29.4 pg (ref 26.0–34.0)
MCHC: 33.1 g/dL (ref 30.0–36.0)
MCV: 89 fL (ref 78.0–100.0)
Platelets: 213 10*3/uL (ref 150–400)
RBC: 4.72 MIL/uL (ref 3.87–5.11)
RDW: 12.1 % (ref 11.5–15.5)
WBC: 8.7 10*3/uL (ref 4.0–10.5)

## 2017-11-18 LAB — I-STAT BETA HCG BLOOD, ED (MC, WL, AP ONLY): I-stat hCG, quantitative: <5 m[IU]/mL (ref <5)

## 2017-11-18 LAB — BRAIN NATRIURETIC PEPTIDE: B NATRIURETIC PEPTIDE 5: 13.6 pg/mL (ref 0.0–100.0)

## 2017-11-18 NOTE — Discharge Instructions (Addendum)
Please read attached information. If you experience any new or worsening signs or symptoms please return to the emergency room for evaluation. Please follow-up with your primary care provider or specialist as discussed.  °

## 2017-11-18 NOTE — ED Triage Notes (Signed)
Patient complains of cough, congestion and CP x several days. States that she has had CHF in past and was in ICU and no cause determined, currently taking no meds for same

## 2017-11-18 NOTE — ED Notes (Signed)
Pt refused monitoring 

## 2017-11-18 NOTE — ED Provider Notes (Addendum)
[facility] Provider Note   CSN: [id number] Arrival date & time: 11/18/17  [id number]   History   Chief Complaint Chief Complaint  Patient presents with  . Cough    HPI Mary [name] NeedsCheria [name] is a 32 y.o. female.  HPI   32 year old female presents today with complaints of cough and chest pain.  Patient notes symptoms started 3 days ago with runny nose, cough and congestion.  She notes it is somewhat productive at night, dry during the day.  She notes associated anterior chest pain, worse with movement, worse with palpation.  She notes no significant shortness of breath, fever or chills.  Patient denies any history DVT or PE, denies any cardiac history.  She denies any trauma to the chest.  Patient does report a past medical history of ICU admission with pulmonary edema.  Chart review shows patient was admitted for 2 days, normal echo no identifiable etiology noted.  No symptoms since.  Patient reports this does not feel similar to the previous episode.   Past Medical History:  Diagnosis Date  . Anxiety   . GERD (gastroesophageal reflux disease)   . Mental disorder     Patient Active Problem List   Diagnosis Date Noted  . Acute pulmonary edema (HCC)   . Acute respiratory failure (HCC) 01/15/2017  . Rupture of membranes with clear amniotic fluid 01/08/2017  . Supervision of normal pregnancy in third trimester 11/20/2016    Past Surgical History:  Procedure Laterality Date  . TONSILLECTOMY    . WISDOM TOOTH EXTRACTION       OB History    Gravida  5   Para  3   Term  3   Preterm      AB  1   Living  3     SAB  1   TAB      Ectopic      Multiple  0   Live Births  3            Home Medications    Prior to Admission medications   Medication Sig Start Date End Date Taking? Authorizing Provider  bacitracin ointment Apply 1 application topically 2 (two) times daily. 01/31/17   Law, Waylan BogaAlexandra M, PA-C  cephALEXin (KEFLEX) 500 MG  capsule Take 1 capsule (500 mg total) by mouth 4 (four) times daily. 01/31/17   Law, Waylan BogaAlexandra M, PA-C  ibuprofen (ADVIL,MOTRIN) 800 MG tablet Take 1 tablet (800 mg total) by mouth 3 (three) times daily. 01/31/17   Law, Waylan BogaAlexandra M, PA-C  oxyCODONE (OXY IR/ROXICODONE) 5 MG immediate release tablet Take 1 tablet (5 mg total) by mouth every 4 (four) hours as needed for moderate pain. 01/10/17   Hermina StaggersErvin, Michael L, MD  oxyCODONE-acetaminophen (PERCOCET/ROXICET) 5-325 MG tablet Take 1-2 tablets by mouth every 6 (six) hours as needed for severe pain. 01/31/17   Emi HolesLaw, Alexandra M, PA-C  Prenatal Vit-Fe Fumarate-FA (MULTIVITAMIN-PRENATAL) 27-0.8 MG TABS tablet Take 1 tablet by mouth daily at 12 noon.    [provider]  ranitidine (ZANTAC) 150 MG tablet Take 150 mg by mouth 2 (two) times daily as needed for heartburn.     [provider]    Family History Family History  Problem Relation Age of Onset  . Hypertension Father     Social History Social History   Tobacco Use  . Smoking status: Never Smoker  . Smokeless tobacco: Never Used  Substance Use Topics  . Alcohol use: No  .  Drug use: No     Allergies   Patient has no known allergies.   Review of Systems Review of Systems  All other systems reviewed and are negative.   Physical Exam Updated Vital Signs BP 113/82   Pulse 70   Temp 97.6 F (36.4 C) (Oral)   Resp 16   LMP 10/28/2017   SpO2 100%   Physical Exam  Constitutional: She is oriented to person, place, and time. She appears well-developed and well-nourished.  HENT:  Head: Normocephalic and atraumatic.  Eyes: Pupils are equal, round, and reactive to light. Conjunctivae are normal. Right eye exhibits no discharge. Left eye exhibits no discharge. No scleral icterus.  Neck: Normal range of motion. No JVD present. No tracheal deviation present.  Cardiovascular: Normal rate, regular rhythm, normal heart sounds and intact distal pulses. Exam reveals no gallop and  no friction rub.  No murmur heard. Pulmonary/Chest: Effort normal. No stridor. No respiratory distress. She has no wheezes. She has no rales. She exhibits tenderness.  TTP anterior chest wall over the sternum   Musculoskeletal: She exhibits no edema.  Neurological: She is alert and oriented to person, place, and time. Coordination normal.  Psychiatric: She has a normal mood and affect. Her behavior is normal. Judgment and thought content normal.  Nursing note and vitals reviewed.   ED Treatments / Results  Labs (all labs ordered are listed, but only abnormal results are displayed) Labs Reviewed  BASIC METABOLIC PANEL - Abnormal; Notable for the following components:      Result Value   Glucose, Bld 111 (*)    All other components within normal limits  CBC  BRAIN NATRIURETIC PEPTIDE  I-STAT TROPONIN, ED  I-STAT BETA HCG BLOOD, ED ([facility], [facility], [facility] ONLY)  I-STAT TROPONIN, ED    EKG EKG Interpretation  Date/Time:  Tuesday November 18 2017 09:10:48 EDT Ventricular Rate:  74 PR Interval:  180 QRS Duration: 86 QT Interval:  378 QTC Calculation: 419 R Axis:   63 Text Interpretation:  Normal sinus rhythm Septal infarct , age undetermined Abnormal ECG since last tracing no significant change Confirmed by Mancel Bale [phone]) on 11/18/2017 1:15:26 PM   Radiology Dg Chest 2 View  Result Date: 11/18/2017 CLINICAL DATA:  Short of breath, cough, congestion and chest pain for several days EXAM: CHEST - 2 VIEW COMPARISON:  Chest x-ray of 01/17/2017 FINDINGS: No active infiltrate or effusion is seen. Mediastinal and hilar contours are unremarkable. The heart is within limits of normal. No bony abnormality is seen. IMPRESSION: No active cardiopulmonary disease. Electronically Signed   By: Dwyane Dee M.D.   On: 11/18/2017 09:53    Procedures Procedures (including critical care time)  Medications Ordered in ED Medications - No data to display   Initial Impression / Assessment and Plan / ED  Course  I have reviewed the triage vital signs and the nursing notes.  Pertinent labs & imaging results that were available during my care of the patient were reviewed by me and considered in my medical decision making (see chart for details).      Final Clinical Impressions(s) / ED Diagnoses   Final diagnoses:  Chest wall pain    Labs: I stat trop, BMP, CBC, BNP, I stat trop, I stat beta hcg  Imaging: DG chest 2 view   Consults:  Therapeutics:  Discharge Meds:   Assessment/Plan: 32 year old female presents today with chest wall pain.  Patient has reproducible pain on exam, she has clear lung sounds negative  chest x-ray and is afebrile low suspicion for infection, no signs of DVT or PE, no signs of ACS or heart failure.  Patient is well-appearing in no acute distress with reassuring EKG and 2- troponins.  She is discharged with strict return precautions, symptomatic care instructions.  She verbalized understanding and agreement to today's plan.    ED Discharge Orders    None        Eyvonne Mechanic, Cordelia Poche 11/18/17 1423    Mancel Bale, MD 11/18/17 2206

## 2018-07-11 IMAGING — CT CT ANGIO CHEST
2 of 6 series · 19 of 36 positions shown · IV contrast (isovue)
Comparison: None.

CLINICAL DATA: Shortness of breath and hemoptysis

EXAM:
CT ANGIOGRAPHY CHEST WITH CONTRAST
TECHNIQUE: Multidetector CT imaging of the chest was performed using the
standard protocol during bolus administration of intravenous
contrast. Multiplanar CT image reconstructions and MIPs were
obtained to evaluate the vascular anatomy.
CONTRAST:  100 cc Isovue 370 intravenous

[Series 8: pe thins · axial · 0.66mm/px · z∈[+1023,+1240]mm · 18 of 342 slices shown]
[im 16/342  lung]
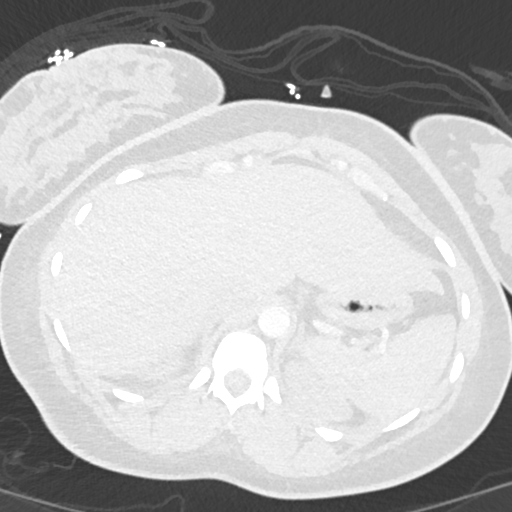
[im 32/342  mediastinal]
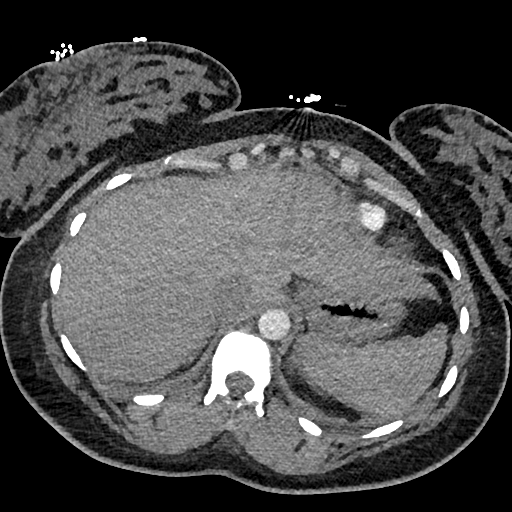
[im 47/342  lung]
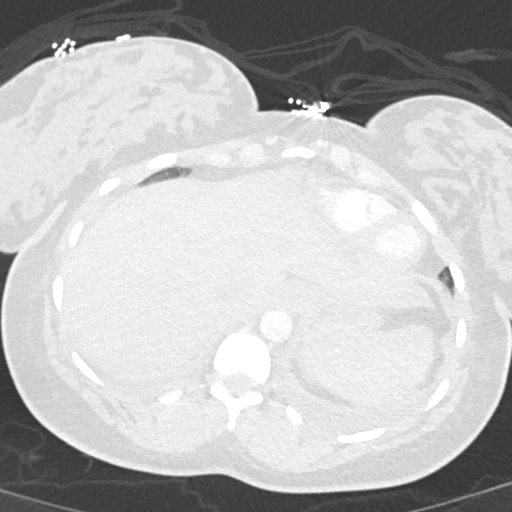
[im 78/342  mediastinal]
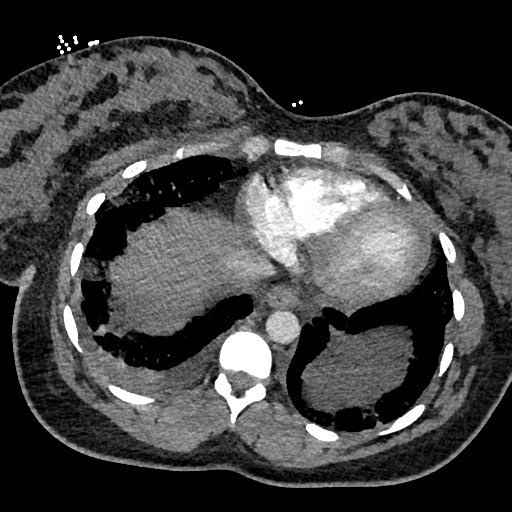
[im 94/342  lung]
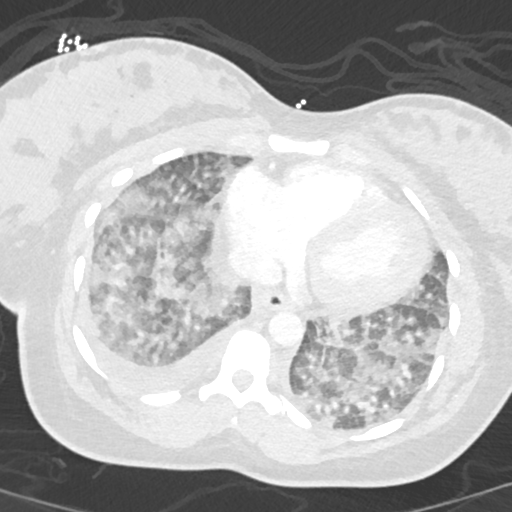
[im 109/342  mediastinal]
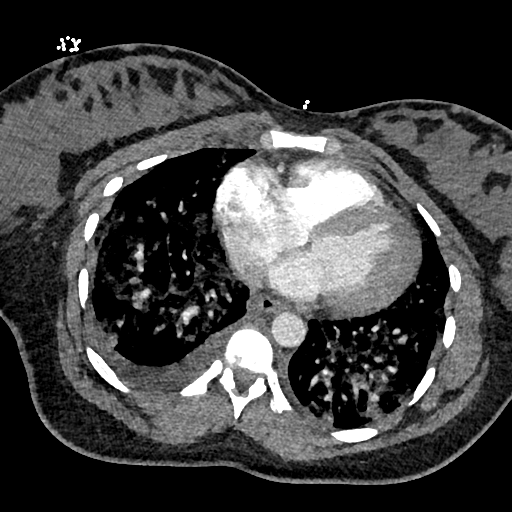
[im 125/342  lung]
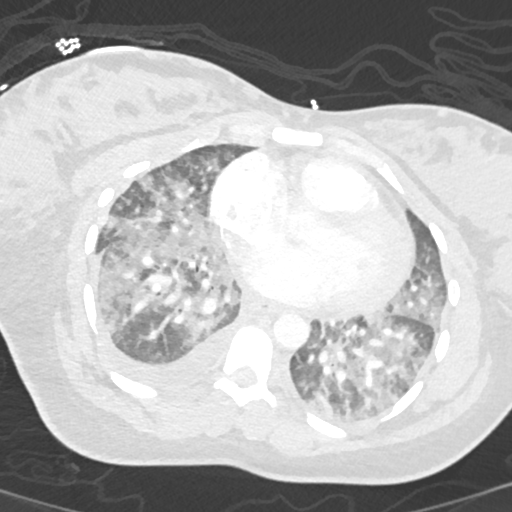
[im 140/342  mediastinal]
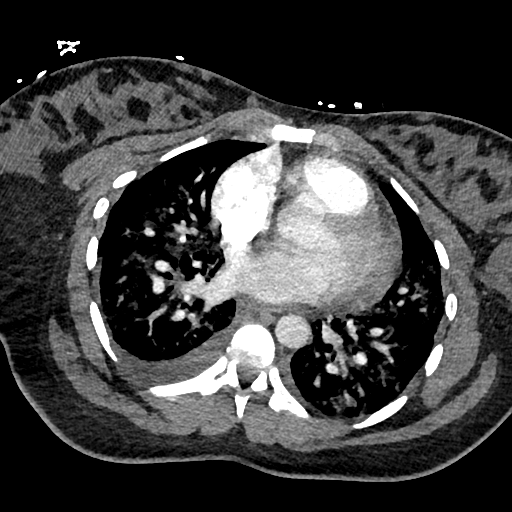
[im 156/342  lung]
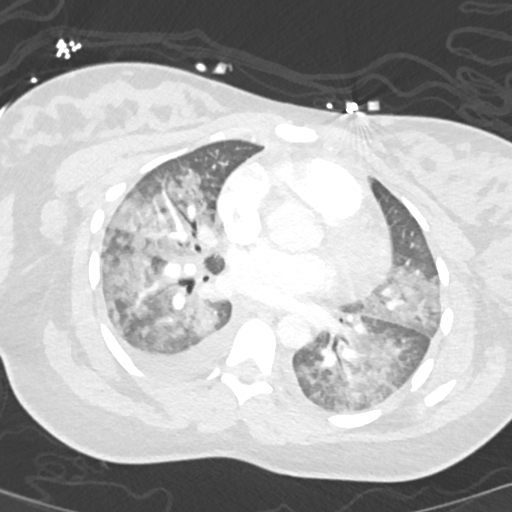
[im 187/342  mediastinal]
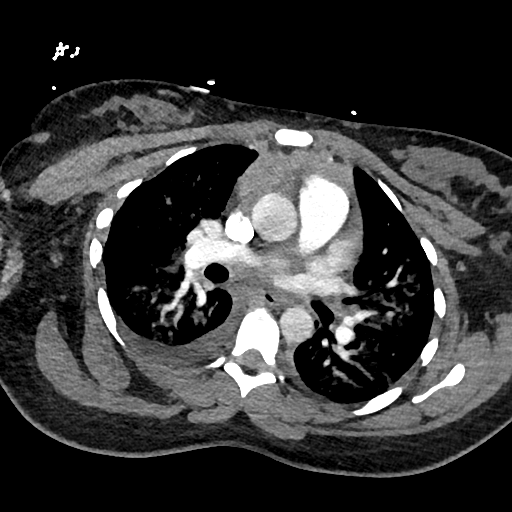
[im 202/342  lung]
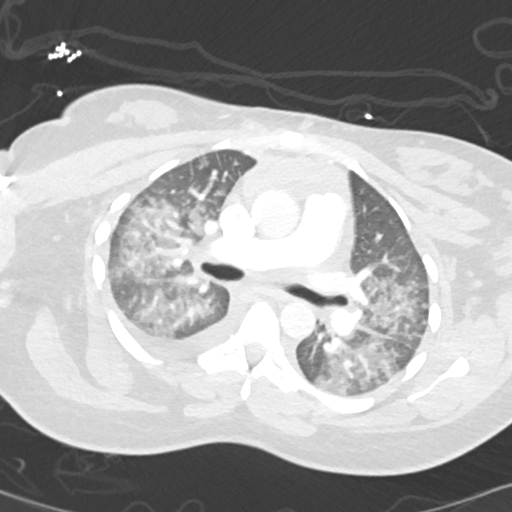
[im 218/342  mediastinal]
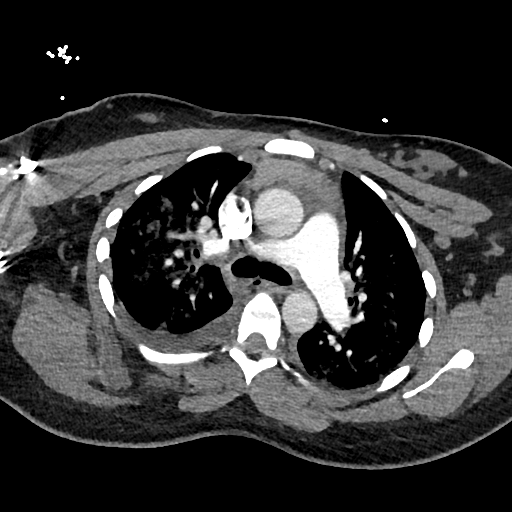
[im 233/342  lung]
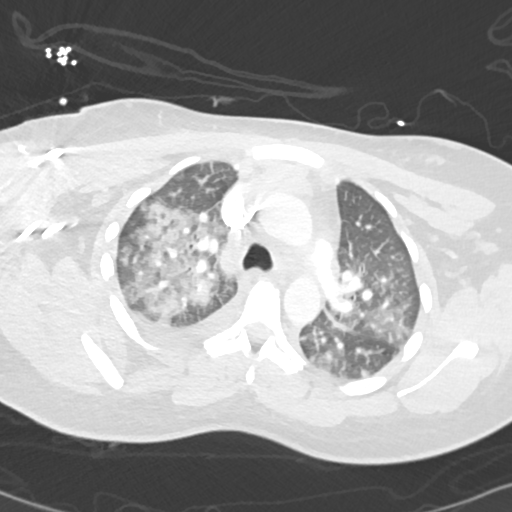
[im 249/342  mediastinal]
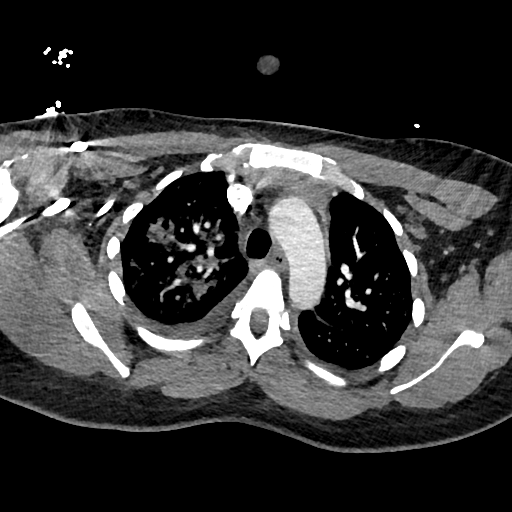
[im 264/342  lung]
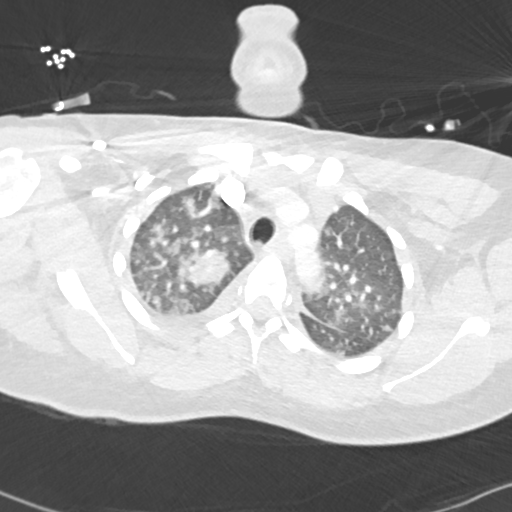
[im 295/342  mediastinal]
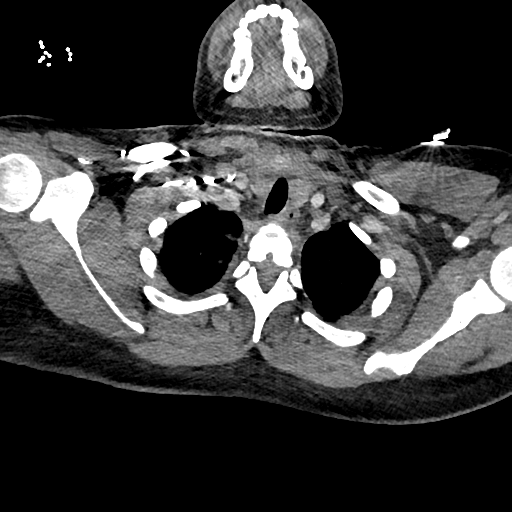
[im 311/342  lung]
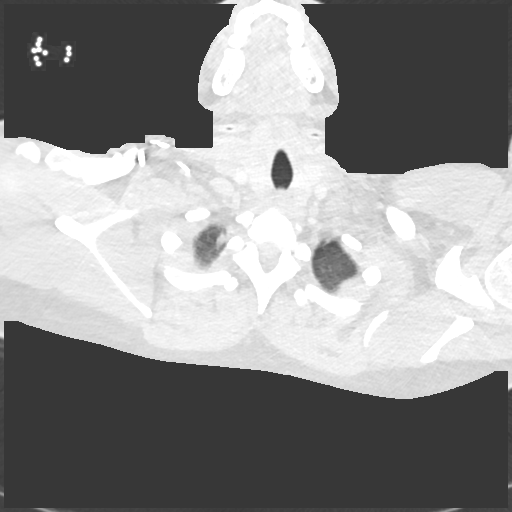
[im 326/342  mediastinal]
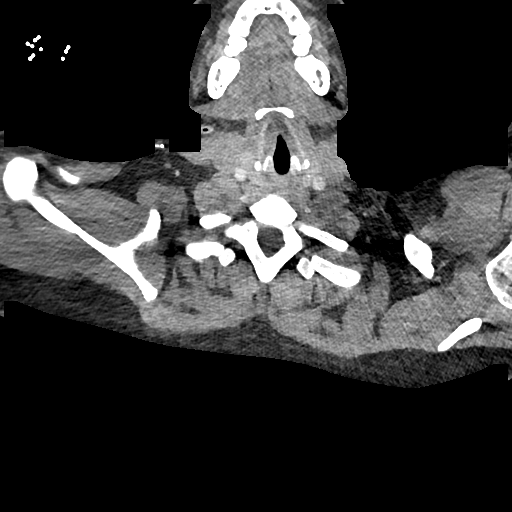

[Series 9: pe 2mm cor · coronal · 0.57mm/px · 1 of 101 slices shown]
[im 51/101  mediastinal]
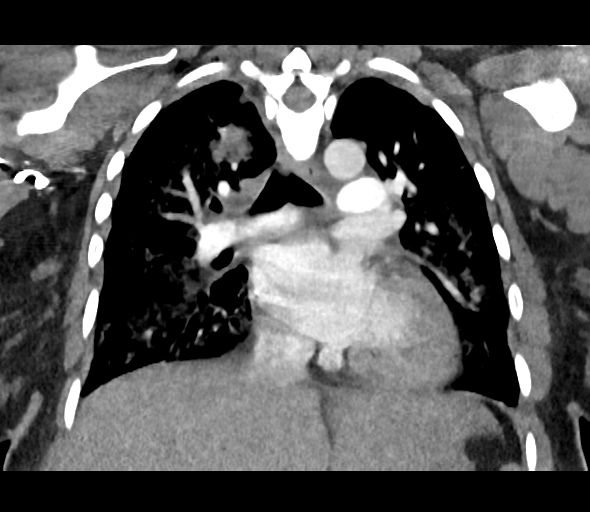

[19 of 36 positions shown; findings below may reference images not displayed]

FINDINGS: Cardiovascular: Satisfactory opacification of the pulmonary arteries
to the segmental level. No evidence of pulmonary embolism.
Borderline heart size. No pericardial effusion.

Mediastinum/Nodes: Negative for adenopathy. Prominent thymus without
discrete mass, 22 mm in AP dimension.

Lungs/Pleura: Symmetric bilateral airspace disease with septal
thickening and small layering effusions. Negative for air leak.

Upper Abdomen: Negative

Musculoskeletal: Negative

Review of the MIP images confirms the above findings.
IMPRESSION: 1. Extensive symmetric airspace disease with septal thickening and
pleural effusions, favor cardiogenic edema. Consider postpartum
cardiomyopathy. Diffuse alveolar hemorrhage is a consideration given
the history of hemoptysis. Multi lobar pneumonia could have this
appearance in the appropriate clinical setting.
2. Negative for pulmonary embolism.
3. Hyperplastic appearance of the thymus.
